# Patient Record
Sex: Female | Born: 1985 | Race: White | Hispanic: No | Marital: Single | Smoking: Current every day smoker
Health system: Southern US, Community
[De-identification: ages and names within clinical notes are randomized; demographics above are authoritative.]

## PROBLEM LIST (undated history)

## (undated) ENCOUNTER — Ambulatory Visit (HOSPITAL_COMMUNITY): Admission: EM | Discharge status: Absent without leave | Payer: Self-pay

## (undated) DIAGNOSIS — R001 Bradycardia, unspecified: Secondary | ICD-10-CM

## (undated) DIAGNOSIS — E119 Type 2 diabetes mellitus without complications: Secondary | ICD-10-CM

## (undated) DIAGNOSIS — N83209 Unspecified ovarian cyst, unspecified side: Secondary | ICD-10-CM

## (undated) HISTORY — PX: OTHER SURGICAL HISTORY: SHX169

## (undated) HISTORY — PX: THUMB ARTHROSCOPY: SHX2509

---
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
## 2007-01-20 ENCOUNTER — Emergency Department (HOSPITAL_COMMUNITY): Admission: EM | Admit: 2007-01-20 | Discharge: 2007-01-20 | Payer: Self-pay | Admitting: Family Medicine

## 2007-03-17 ENCOUNTER — Emergency Department (HOSPITAL_COMMUNITY): Admission: EM | Admit: 2007-03-17 | Discharge: 2007-03-17 | Payer: Self-pay | Admitting: Emergency Medicine

## 2007-06-24 ENCOUNTER — Emergency Department (HOSPITAL_COMMUNITY): Admission: EM | Admit: 2007-06-24 | Discharge: 2007-06-24 | Payer: Self-pay | Admitting: Emergency Medicine

## 2008-08-08 ENCOUNTER — Emergency Department (HOSPITAL_COMMUNITY): Admission: EM | Admit: 2008-08-08 | Discharge: 2008-08-08 | Payer: Self-pay | Admitting: Emergency Medicine

## 2008-10-23 ENCOUNTER — Emergency Department (HOSPITAL_COMMUNITY): Admission: EM | Admit: 2008-10-23 | Discharge: 2008-10-23 | Payer: Self-pay | Admitting: Emergency Medicine

## 2008-12-16 ENCOUNTER — Emergency Department (HOSPITAL_COMMUNITY): Admission: EM | Admit: 2008-12-16 | Discharge: 2008-12-17 | Payer: Self-pay | Admitting: Emergency Medicine

## 2009-09-17 ENCOUNTER — Emergency Department (HOSPITAL_COMMUNITY): Admission: EM | Admit: 2009-09-17 | Discharge: 2009-09-17 | Payer: Self-pay | Admitting: Emergency Medicine

## 2010-02-08 ENCOUNTER — Emergency Department (HOSPITAL_COMMUNITY): Admission: EM | Admit: 2010-02-08 | Discharge: 2010-02-08 | Payer: Self-pay | Admitting: Emergency Medicine

## 2010-09-20 ENCOUNTER — Inpatient Hospital Stay (HOSPITAL_COMMUNITY): Admission: EM | Admit: 2010-09-20 | Discharge: 2010-09-22 | Payer: Self-pay | Admitting: Emergency Medicine

## 2010-11-30 ENCOUNTER — Emergency Department (HOSPITAL_COMMUNITY)
Admission: EM | Admit: 2010-11-30 | Discharge: 2010-11-30 | Payer: Self-pay | Source: Home / Self Care | Admitting: Emergency Medicine

## 2011-03-13 LAB — URINALYSIS, ROUTINE W REFLEX MICROSCOPIC
Bilirubin Urine: NEGATIVE
Nitrite: NEGATIVE
Specific Gravity, Urine: 1.028 (ref 1.005–1.030)
pH: 6.5 (ref 5.0–8.0)

## 2011-03-15 LAB — CBC
MCV: 86.8 fL (ref 78.0–100.0)
Platelets: 223 10*3/uL (ref 150–400)
RBC: 5.09 MIL/uL (ref 3.87–5.11)
WBC: 8.1 10*3/uL (ref 4.0–10.5)

## 2011-03-15 LAB — ANAEROBIC CULTURE

## 2011-03-15 LAB — BASIC METABOLIC PANEL
Chloride: 106 mEq/L (ref 96–112)
GFR calc Af Amer: >60 mL/min (ref >60)
Potassium: 4 mEq/L (ref 3.5–5.1)

## 2011-03-21 LAB — URINALYSIS, ROUTINE W REFLEX MICROSCOPIC
Glucose, UA: NEGATIVE mg/dL
pH: 5.5 (ref 5.0–8.0)

## 2011-03-21 LAB — URINE MICROSCOPIC-ADD ON

## 2011-04-06 LAB — POCT PREGNANCY, URINE: Preg Test, Ur: NEGATIVE

## 2011-08-27 ENCOUNTER — Emergency Department (HOSPITAL_COMMUNITY)
Admission: EM | Admit: 2011-08-27 | Discharge: 2011-08-28 | Disposition: A | Discharge status: Home / Self Care | Payer: Self-pay | Attending: Emergency Medicine | Admitting: Emergency Medicine

## 2011-08-27 DIAGNOSIS — M545 Low back pain, unspecified: Secondary | ICD-10-CM | POA: Insufficient documentation

## 2011-08-27 DIAGNOSIS — G8929 Other chronic pain: Secondary | ICD-10-CM | POA: Insufficient documentation

## 2011-08-27 DIAGNOSIS — M62838 Other muscle spasm: Secondary | ICD-10-CM | POA: Insufficient documentation

## 2011-08-27 DIAGNOSIS — M79609 Pain in unspecified limb: Secondary | ICD-10-CM | POA: Insufficient documentation

## 2011-09-04 ENCOUNTER — Emergency Department (HOSPITAL_COMMUNITY)
Admission: EM | Admit: 2011-09-04 | Discharge: 2011-09-05 | Disposition: A | Discharge status: Home / Self Care | Payer: Self-pay | Attending: Emergency Medicine | Admitting: Emergency Medicine

## 2011-09-04 DIAGNOSIS — M545 Low back pain, unspecified: Secondary | ICD-10-CM | POA: Insufficient documentation

## 2011-09-28 LAB — URINALYSIS, ROUTINE W REFLEX MICROSCOPIC
Bilirubin Urine: NEGATIVE
Glucose, UA: NEGATIVE
Hgb urine dipstick: NEGATIVE
Ketones, ur: NEGATIVE
Nitrite: NEGATIVE
Protein, ur: NEGATIVE
Specific Gravity, Urine: 1.018
Urobilinogen, UA: 1
pH: 6.5

## 2011-09-28 LAB — WET PREP, GENITAL
Trich, Wet Prep: NONE SEEN
Yeast Wet Prep HPF POC: NONE SEEN

## 2011-09-28 LAB — URINE CULTURE: Colony Count: >=100000

## 2011-09-28 LAB — URINE MICROSCOPIC-ADD ON

## 2011-09-28 LAB — GC/CHLAMYDIA PROBE AMP, GENITAL
Chlamydia, DNA Probe: NEGATIVE
GC Probe Amp, Genital: NEGATIVE

## 2011-09-28 LAB — POCT PREGNANCY, URINE: Preg Test, Ur: NEGATIVE

## 2011-10-02 LAB — CBC
HCT: 35.7 — ABNORMAL LOW
Hemoglobin: 12.1
MCHC: 33.9
RDW: 12.7

## 2011-10-02 LAB — URINALYSIS, ROUTINE W REFLEX MICROSCOPIC
Bilirubin Urine: NEGATIVE
Ketones, ur: NEGATIVE
Nitrite: NEGATIVE
Protein, ur: NEGATIVE
Urobilinogen, UA: 0.2
pH: 6.5

## 2011-10-02 LAB — DIFFERENTIAL
Basophils Absolute: 0.1
Basophils Relative: 1
Monocytes Relative: 6
Neutro Abs: 4.4
Neutrophils Relative %: 59

## 2011-10-02 LAB — URINE CULTURE

## 2011-10-02 LAB — LIPASE, BLOOD: Lipase: 30

## 2011-10-02 LAB — WET PREP, GENITAL
Trich, Wet Prep: NONE SEEN
Yeast Wet Prep HPF POC: NONE SEEN

## 2011-10-02 LAB — COMPREHENSIVE METABOLIC PANEL
Alkaline Phosphatase: 51
BUN: 14
Calcium: 8.6
Glucose, Bld: 100 — ABNORMAL HIGH
Potassium: 3.9
Total Protein: 6.2

## 2011-10-02 LAB — POCT PREGNANCY, URINE: Preg Test, Ur: NEGATIVE

## 2011-10-02 LAB — GC/CHLAMYDIA PROBE AMP, GENITAL: GC Probe Amp, Genital: NEGATIVE

## 2012-11-29 ENCOUNTER — Emergency Department (HOSPITAL_COMMUNITY)
Admission: EM | Admit: 2012-11-29 | Discharge: 2012-11-29 | Disposition: A | Discharge status: Home / Self Care | Payer: Self-pay | Attending: Emergency Medicine | Admitting: Emergency Medicine

## 2012-11-29 ENCOUNTER — Encounter (HOSPITAL_COMMUNITY): Payer: Self-pay | Admitting: Emergency Medicine

## 2012-11-29 DIAGNOSIS — E669 Obesity, unspecified: Secondary | ICD-10-CM | POA: Insufficient documentation

## 2012-11-29 DIAGNOSIS — F17200 Nicotine dependence, unspecified, uncomplicated: Secondary | ICD-10-CM | POA: Insufficient documentation

## 2012-11-29 DIAGNOSIS — M79671 Pain in right foot: Secondary | ICD-10-CM

## 2012-11-29 DIAGNOSIS — M2140 Flat foot [pes planus] (acquired), unspecified foot: Secondary | ICD-10-CM | POA: Insufficient documentation

## 2012-11-29 DIAGNOSIS — L84 Corns and callosities: Secondary | ICD-10-CM | POA: Insufficient documentation

## 2012-11-29 MED ORDER — OXYCODONE-ACETAMINOPHEN 5-325 MG PO TABS: ORAL_TABLET | ORAL | Status: DC

## 2012-11-29 NOTE — Progress Notes (Signed)
Orthopedic Tech Progress Note Patient Details:  Jennifer Arnold [birth year] [id number] Patient fitted with crutches for height and comfort. Patient stated she has used crutches in the past and feels comfortable with use; demonstrated proper use  Ortho Devices Type of Ortho Device: Crutches Ortho Device/Splint Interventions: Application   Asia R Thompson 11/29/2012, 3:21 PM

## 2012-11-29 NOTE — ED Provider Notes (Signed)
History     CSN: [id number]  Arrival date & time 11/29/12  1410   First MD Initiated Contact with Patient 11/29/12 1419      Chief Complaint  Patient presents with  . Foot Pain    l/foot pain x 6 months    (Consider location/radiation/quality/duration/timing/severity/associated sxs/prior treatment) HPI  Jennifer Arnold is a 26 y.o. female complaining of bilateral foot pain worse on the left than the right for several months worsening over the course of last 2 days. Patient works 60 hours on her feet every week. She denies any trauma. She is overweight and describes pain as worse in the a.m. pain is rated at 8/10 described as aching. Denies trauma.   History reviewed. No pertinent past medical history.  Past Surgical History  Procedure Date  . Other surgical history     Thumb surgery    Family History  Problem Relation Age of Onset  . Hypertension Mother   . Diabetes Mother   . Asthma Father   . Osteoarthritis Father     History  Substance Use Topics  . Smoking status: Current Every Day Smoker    Types: Cigarettes  . Smokeless tobacco: Not on file  . Alcohol Use: Yes    OB History    Grav Para Term Preterm Abortions TAB SAB Ect Mult Living                  Review of Systems  Constitutional: Negative for fever.  Respiratory: Negative for shortness of breath.   Cardiovascular: Negative for chest pain.  Gastrointestinal: Negative for nausea, vomiting, abdominal pain and diarrhea.  All other systems reviewed and are negative.    Allergies  Review of patient's allergies indicates no known allergies.  Home Medications   Current Outpatient Rx  Name  Route  Sig  Dispense  Refill  . OXYCODONE-ACETAMINOPHEN 5-325 MG PO TABS      1 to 2 tabs PO q6hrs  PRN for pain   15 tablet   0     BP 142/77  Pulse 83  Temp 97.9 F (36.6 C) (Oral)  Resp 18  SpO2 100%  LMP 11/29/2012  Physical Exam  Nursing note and vitals reviewed. Constitutional: She is  oriented to person, place, and time. She appears well-developed and well-nourished. No distress.       Obese  HENT:  Head: Normocephalic and atraumatic.  Eyes: Conjunctivae normal and EOM are normal.  Cardiovascular: Normal rate.   Pulmonary/Chest: Effort normal. No stridor.  Musculoskeletal: Normal range of motion.       Left arch is fallen. 1 x 3 cm callous to great toe mound. Diffusely tender to palpation.   Neurological: She is alert and oriented to person, place, and time.  Psychiatric: She has a normal mood and affect.    ED Course  Procedures (including critical care time)  Labs Reviewed - No data to display No results found.   1. Pes planus   2. Foot pain, bilateral   3. Callus of foot   4. Obesity       MDM  Exacerbation of chronic foot pain. Patient has flat feet and likely plantar fasciitis based on pain worsening in the a.m. I have instructed her on how to perform and stretches and encourage her to follow with podiatry.   Pt verbalized understanding and agrees with care plan. Outpatient follow-up and return precautions given.  ]         Wynetta Emery, PA-C  11/29/12 1454  Bayan Kushnir, PA-C 11/29/12 1538

## 2012-11-29 NOTE — ED Notes (Signed)
Pt reports persistent feet pain. Pain in l/foot increased over the last few days. Denies injury

## 2012-11-29 NOTE — ED Provider Notes (Signed)
Medical screening examination/treatment/procedure(s) were performed by non-physician practitioner and as supervising physician I was immediately available for consultation/collaboration.   Celene Kras, MD 11/29/12 1640

## 2013-05-25 ENCOUNTER — Emergency Department (HOSPITAL_COMMUNITY)
Admission: EM | Admit: 2013-05-25 | Discharge: 2013-05-25 | Disposition: A | Discharge status: Home / Self Care | Payer: Self-pay | Attending: Emergency Medicine | Admitting: Emergency Medicine

## 2013-05-25 ENCOUNTER — Encounter (HOSPITAL_COMMUNITY): Payer: Self-pay | Admitting: *Deleted

## 2013-05-25 DIAGNOSIS — F17200 Nicotine dependence, unspecified, uncomplicated: Secondary | ICD-10-CM | POA: Insufficient documentation

## 2013-05-25 DIAGNOSIS — Z8619 Personal history of other infectious and parasitic diseases: Secondary | ICD-10-CM | POA: Insufficient documentation

## 2013-05-25 DIAGNOSIS — Z8742 Personal history of other diseases of the female genital tract: Secondary | ICD-10-CM | POA: Insufficient documentation

## 2013-05-25 DIAGNOSIS — N939 Abnormal uterine and vaginal bleeding, unspecified: Secondary | ICD-10-CM

## 2013-05-25 DIAGNOSIS — N938 Other specified abnormal uterine and vaginal bleeding: Secondary | ICD-10-CM | POA: Insufficient documentation

## 2013-05-25 DIAGNOSIS — N949 Unspecified condition associated with female genital organs and menstrual cycle: Secondary | ICD-10-CM | POA: Insufficient documentation

## 2013-05-25 DIAGNOSIS — Z3202 Encounter for pregnancy test, result negative: Secondary | ICD-10-CM | POA: Insufficient documentation

## 2013-05-25 HISTORY — DX: Unspecified ovarian cyst, unspecified side: N83.209

## 2013-05-25 LAB — URINE MICROSCOPIC-ADD ON

## 2013-05-25 LAB — WET PREP, GENITAL
Trich, Wet Prep: NONE SEEN
Yeast Wet Prep HPF POC: NONE SEEN

## 2013-05-25 LAB — CBC WITH DIFFERENTIAL/PLATELET
Basophils Absolute: 0 10*3/uL (ref 0.0–0.1)
Eosinophils Absolute: 0.2 10*3/uL (ref 0.0–0.7)
Eosinophils Relative: 2 % (ref 0–5)
HCT: 41.3 % (ref 36.0–46.0)
MCH: 30.1 pg (ref 26.0–34.0)
MCV: 85.9 fL (ref 78.0–100.0)
Monocytes Absolute: 0.4 10*3/uL (ref 0.1–1.0)
Platelets: 227 10*3/uL (ref 150–400)
RDW: 13.7 % (ref 11.5–15.5)

## 2013-05-25 LAB — URINALYSIS, ROUTINE W REFLEX MICROSCOPIC
Glucose, UA: NEGATIVE mg/dL
Leukocytes, UA: NEGATIVE
Specific Gravity, Urine: 1.014 (ref 1.005–1.030)
pH: 5.5 (ref 5.0–8.0)

## 2013-05-25 NOTE — ED Notes (Signed)
Pt c/o bleeding "alot" asked how many pads in 12 hours "sigh, a lot" states she is used to having a heavy cycle." Past year I have bled every single day"

## 2013-05-25 NOTE — ED Provider Notes (Signed)
History     CSN: [id number]  Arrival date & time 05/25/13  0003   First MD Initiated Contact with Patient 05/25/13 0040      Chief Complaint  Patient presents with  . Vaginal Bleeding    (Consider location/radiation/quality/duration/timing/severity/associated sxs/prior treatment) HPI Comments: PAtient reports vaginal bleeding for the past year light to heavy  Was treated for chlamydia 1 years ago and again 1 month ago( partner was treated at that time as well)   For the past 2 days vaginal bleeding has been heavier than normal   Patient is a 27 y.o. female presenting with vaginal bleeding. The history is provided by the patient.  Vaginal Bleeding Quality:  Bright red Severity:  Moderate Onset quality:  Unable to specify Timing:  Intermittent Progression:  Worsening Menstrual history:  Irregular Possible pregnancy: yes   Context: spontaneously   Relieved by:  Nothing Worsened by:  Nothing tried Ineffective treatments:  None tried Associated symptoms: no abdominal pain, no dizziness, no dysuria, no fever, no nausea and no vaginal discharge   Risk factors: STD and unprotected sex   Risk factors: no new sexual partner     Past Medical History  Diagnosis Date  . Ovarian cyst     Past Surgical History  Procedure Laterality Date  . Other surgical history      Thumb surgery  . Thumb arthroscopy      Family History  Problem Relation Age of Onset  . Hypertension Mother   . Diabetes Mother   . Asthma Father   . Osteoarthritis Father     History  Substance Use Topics  . Smoking status: Current Every Day Smoker    Types: Cigarettes  . Smokeless tobacco: Not on file  . Alcohol Use: No    OB History   Grav Para Term Preterm Abortions TAB SAB Ect Mult Living                  Review of Systems  Unable to perform ROS Constitutional: Negative for fever and chills.  Gastrointestinal: Negative for nausea and abdominal pain.  Genitourinary: Positive for vaginal  bleeding. Negative for dysuria, vaginal discharge and vaginal pain.  Musculoskeletal: Negative for myalgias.  Neurological: Negative for dizziness and weakness.  All other systems reviewed and are negative.    Allergies  Review of patient's allergies indicates no known allergies.  Home Medications  No current outpatient prescriptions on file.  BP 138/84  Pulse 89  Temp(Src) 98.5 F (36.9 C) (Oral)  Resp 20  SpO2 98%  Physical Exam  Nursing note and vitals reviewed. Constitutional: She is oriented to person, place, and time. She appears well-developed and well-nourished.  Morbid obesity  HENT:  Head: Normocephalic and atraumatic.  Eyes: Pupils are equal, round, and reactive to light.  Neck: Normal range of motion.  Cardiovascular: Normal rate and regular rhythm.   Pulmonary/Chest: Effort normal.  Abdominal: Bowel sounds are normal. She exhibits no distension. There is no tenderness.  Genitourinary: Vagina normal and uterus normal. Uterus is not tender. Cervix exhibits no discharge. Right adnexum displays no tenderness and no fullness. Left adnexum displays no tenderness and no fullness.  Small amount of blood in vaginal vault  Musculoskeletal: Normal range of motion. She exhibits no tenderness.  Neurological: She is alert and oriented to person, place, and time.  Skin: Skin is warm and dry.    ED Course  Procedures (including critical care time)  Labs Reviewed  WET PREP, GENITAL - Abnormal; Notable  for the following:    Clue Cells Wet Prep HPF POC RARE (*)    WBC, Wet Prep HPF POC RARE (*)    All other components within normal limits  URINALYSIS, ROUTINE W REFLEX MICROSCOPIC - Abnormal; Notable for the following:    Hgb urine dipstick LARGE (*)    All other components within normal limits  GC/CHLAMYDIA PROBE AMP  CBC WITH DIFFERENTIAL  URINE MICROSCOPIC-ADD ON  POCT PREGNANCY, URINE   No results found.   1. Abnormal vaginal bleeding       MDM   No  abnormalities in labs patient referred to [facility] for further evaluation         Arman Filter, NP 05/25/13 [phone]

## 2013-05-25 NOTE — ED Notes (Addendum)
Pt states she is not on birth control and has never been pregnant. She began spotting every day for a year but the past 4 days she has been bleeding  really bad. When she woke up the bed was covered. Pt went to health dept 3 or 4 weeks ago and was treated for chlamydia and bacterial vaginosis. Bleeding calmed down when she was on the medication but it started back extremely heavy. Pt has no other medical history.

## 2013-05-25 NOTE — ED Provider Notes (Signed)
Medical screening examination/treatment/procedure(s) were performed by non-physician practitioner and as supervising physician I was immediately available for consultation/collaboration.  Lyanne Co, MD 05/25/13 [phone]

## 2015-01-04 ENCOUNTER — Encounter (HOSPITAL_COMMUNITY): Payer: Self-pay | Admitting: Emergency Medicine

## 2015-01-04 ENCOUNTER — Emergency Department (HOSPITAL_COMMUNITY): Payer: BLUE CROSS/BLUE SHIELD

## 2015-01-04 ENCOUNTER — Emergency Department (HOSPITAL_COMMUNITY)
Admission: EM | Admit: 2015-01-04 | Discharge: 2015-01-04 | Disposition: A | Discharge status: Home / Self Care | Payer: BLUE CROSS/BLUE SHIELD | Attending: Emergency Medicine | Admitting: Emergency Medicine

## 2015-01-04 DIAGNOSIS — Z720 Tobacco use: Secondary | ICD-10-CM | POA: Diagnosis not present

## 2015-01-04 DIAGNOSIS — Z8742 Personal history of other diseases of the female genital tract: Secondary | ICD-10-CM | POA: Diagnosis not present

## 2015-01-04 DIAGNOSIS — M79672 Pain in left foot: Secondary | ICD-10-CM | POA: Insufficient documentation

## 2015-01-04 DIAGNOSIS — Z79899 Other long term (current) drug therapy: Secondary | ICD-10-CM | POA: Insufficient documentation

## 2015-01-04 MED ORDER — IBUPROFEN 200 MG PO TABS
600.0000 mg | ORAL_TABLET | Freq: Once | ORAL | Status: AC
  Administered 2015-01-04: 600 mg via ORAL
  Filled 2015-01-04: qty 3

## 2015-01-04 NOTE — ED Notes (Signed)
Pt states she has been having pain in her left foot for 2 days  Pt states yesterday the pain increased and she is having stabbing pain in the middle of her heal  Pt states she is unable to put pressure on her heel  Pt states last time they told her she had plantar fasciatus

## 2015-01-04 NOTE — Discharge Instructions (Signed)
Your x-ray does not reveal any abnormality in the bone formation.  There is no apparent abscess or change in the fat pad.  I would recommend getting a heel cup to place inside your shoe fracture.  Comfort.  You also can make an appointment with orthopedic surgery for further evaluation.  If your pain persists past 2 weeks

## 2015-01-04 NOTE — ED Provider Notes (Signed)
CSN: [id number]     Arrival date & time 01/04/15  7829 History   First MD Initiated Contact with Patient 01/04/15 [phone]     Chief Complaint  Patient presents with  . Foot Pain     (Consider location/radiation/quality/duration/timing/severity/associated sxs/prior Treatment) HPI Comments: This a normally healthy, morbidly obese female who's complaining of 2 days of left heel pain, worse when walking.  States she is unable to put full pressure on her foot.  She does have a history of plantar fasciitis over year ago.  She says pain is different as the plantar fasciitis pain is more in the arch of her foot and this is definitely just didn't heal.  She denies any trauma, history of plantar warts  Patient is a 29 y.o. female presenting with lower extremity pain. The history is provided by the patient.  Foot Pain This is a new problem. The current episode started in the past 7 days. The problem occurs constantly. The problem has been unchanged. Pertinent negatives include no fever, numbness, rash or weakness. The symptoms are aggravated by walking. She has tried nothing for the symptoms. The treatment provided no relief.    Past Medical History  Diagnosis Date  . Ovarian cyst    Past Surgical History  Procedure Laterality Date  . Other surgical history      Thumb surgery  . Thumb arthroscopy     Family History  Problem Relation Age of Onset  . Hypertension Mother   . Diabetes Mother   . Asthma Father   . Osteoarthritis Father   . Diabetes Other   . Hypertension Other   . Stroke Other    History  Substance Use Topics  . Smoking status: Current Every Day Smoker    Types: Cigarettes  . Smokeless tobacco: Not on file  . Alcohol Use: No   OB History    No data available     Review of Systems  Constitutional: Negative for fever.  Skin: Negative for rash and wound.  Neurological: Negative for weakness and numbness.  All other systems reviewed and are negative.     Allergies    Review of patient's allergies indicates no known allergies.  Home Medications   Prior to Admission medications   Medication Sig Start Date End Date Taking? Authorizing Provider  POTASSIUM PO Take 1 tablet by mouth daily.   Yes Historical Provider, MD   BP 142/62 mmHg  Pulse 90  Resp 18  Ht  (1.753 m)  Wt 305 lb (138.347 kg)  BMI 45.02 kg/m2  SpO2 99%  LMP  (LMP Unknown) Physical Exam  Constitutional: She appears well-developed and well-nourished.  HENT:  Head: Normocephalic.  Eyes: Pupils are equal, round, and reactive to light.  Neck: Normal range of motion.  Cardiovascular: Normal rate and regular rhythm.   Pulmonary/Chest: Effort normal and breath sounds normal.  Musculoskeletal: Normal range of motion. She exhibits tenderness. She exhibits no edema.       Feet:  Neurological: She is alert.  Skin: Skin is warm. No erythema.  Nursing note and vitals reviewed.   ED Course  Procedures (including critical care time) Labs Review Labs Reviewed - No data to display  Imaging Review Dg Foot Complete Left  01/04/2015   CLINICAL DATA:  Increasing left heel pain. No known trauma. Patient cannot put weight on foot.  EXAM: LEFT FOOT - COMPLETE 3+ VIEW  COMPARISON:  None.  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  No evidence of acute fracture or subluxation. No focal bone lesion or bone destruction. Bone cortex and trabecular architecture appear intact. No radiopaque soft tissue foreign bodies.  IMPRESSION: No acute bony abnormalities.   Electronically Signed   By: Burman NievesWilliam  Stevens M.D.   On: 01/04/2015 05:08     EKG Interpretation None      MDM  X-rays been reviewed.  There is no heel spur.  There is no decrease in the fat pad on the heel.  I recommend that patient purchase a heel cup to place inside her shoe, given a couple weeks with regular doses of ibuprofen.  If pain persists, follow-up with orthopedic surgery Final diagnoses:   Heel pain, left         Arman FilterGail K Huxley Shurley, NP 01/04/15 [phone]  Tomasita CrumbleAdeleke Oni, MD 01/04/15 [phone]

## 2015-08-30 IMAGING — CR DG FOOT COMPLETE 3+V*L*
3 series · 3 of 3 positions shown · non-contrast
Comparison: None.

CLINICAL DATA: Increasing left heel pain. No known trauma. Patient
cannot put weight on foot.

EXAM:
LEFT FOOT - COMPLETE 3+ VIEW

[x foot ap left]
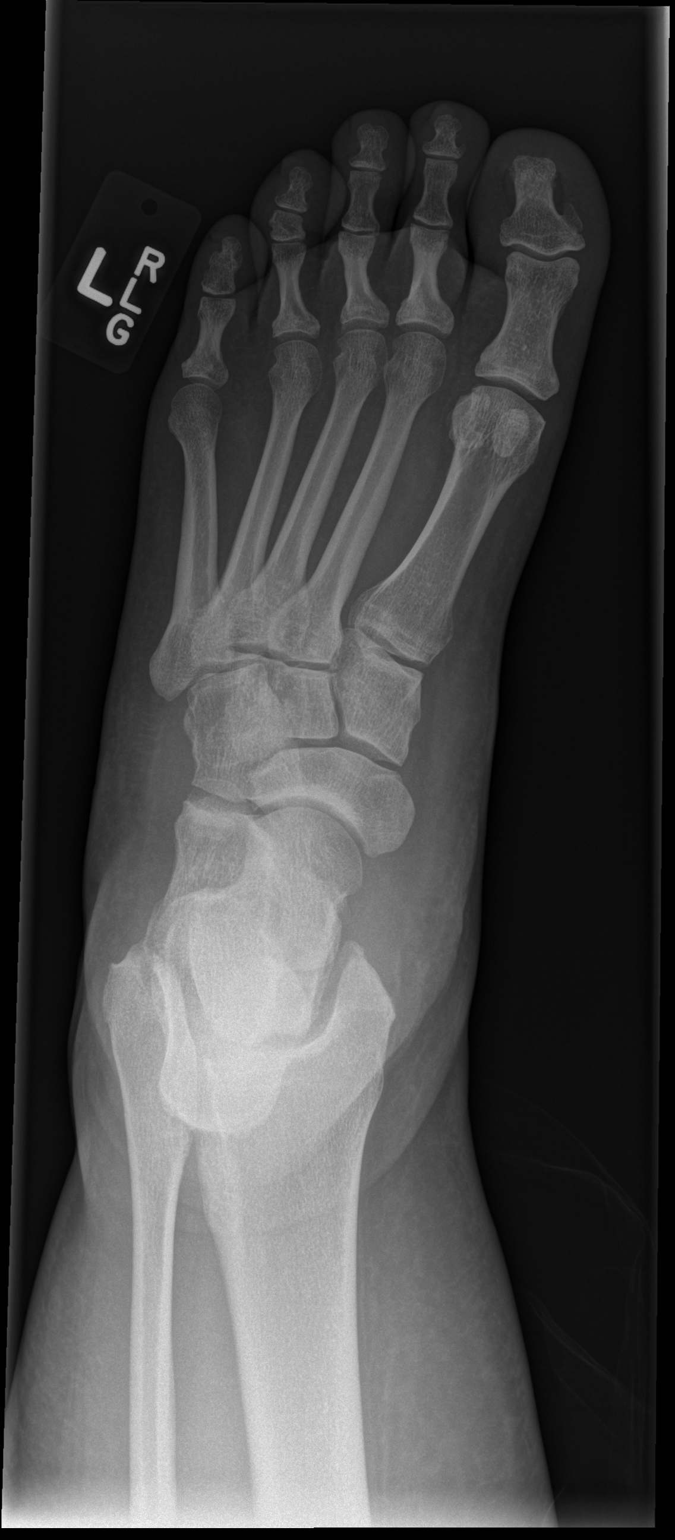

[x foot obl left]
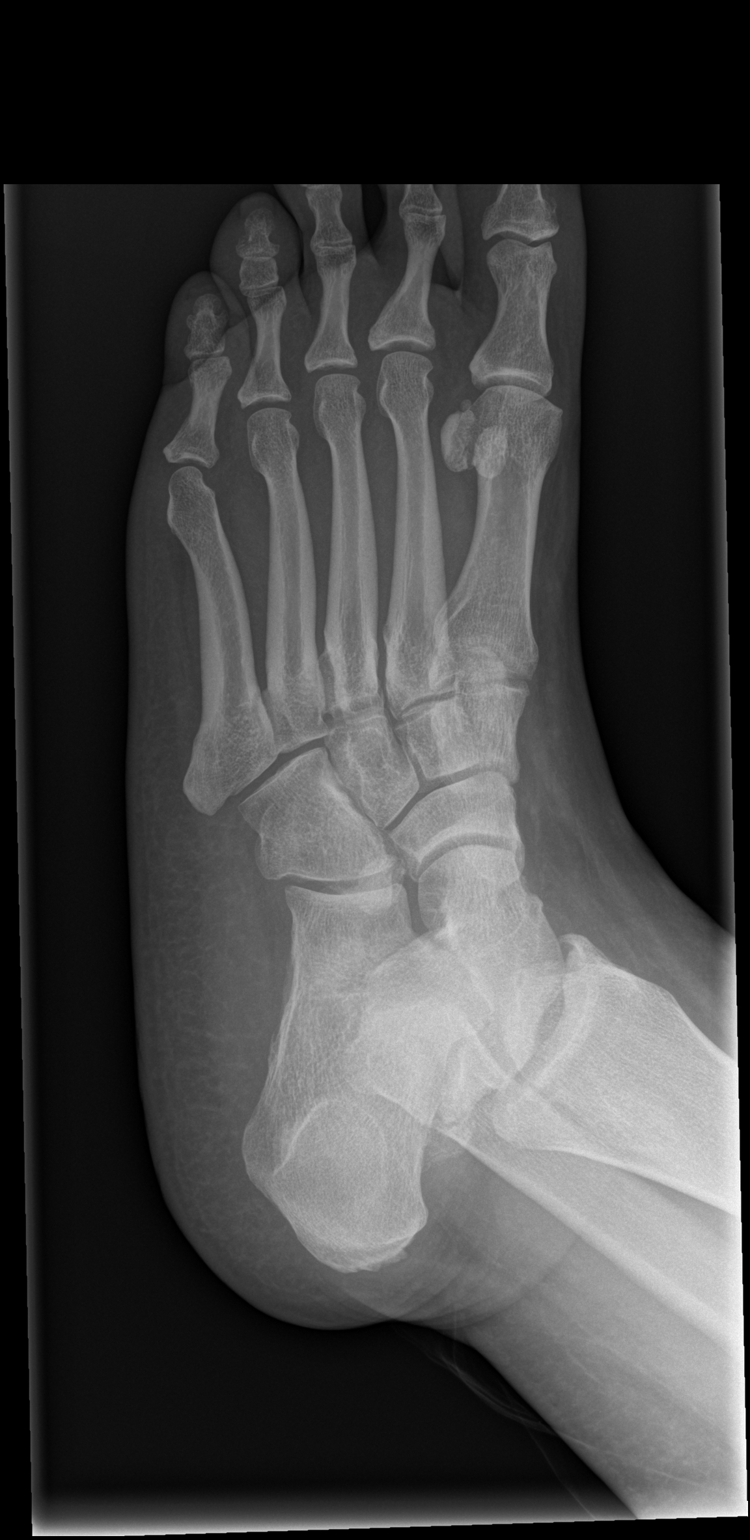

[x foot lat left]
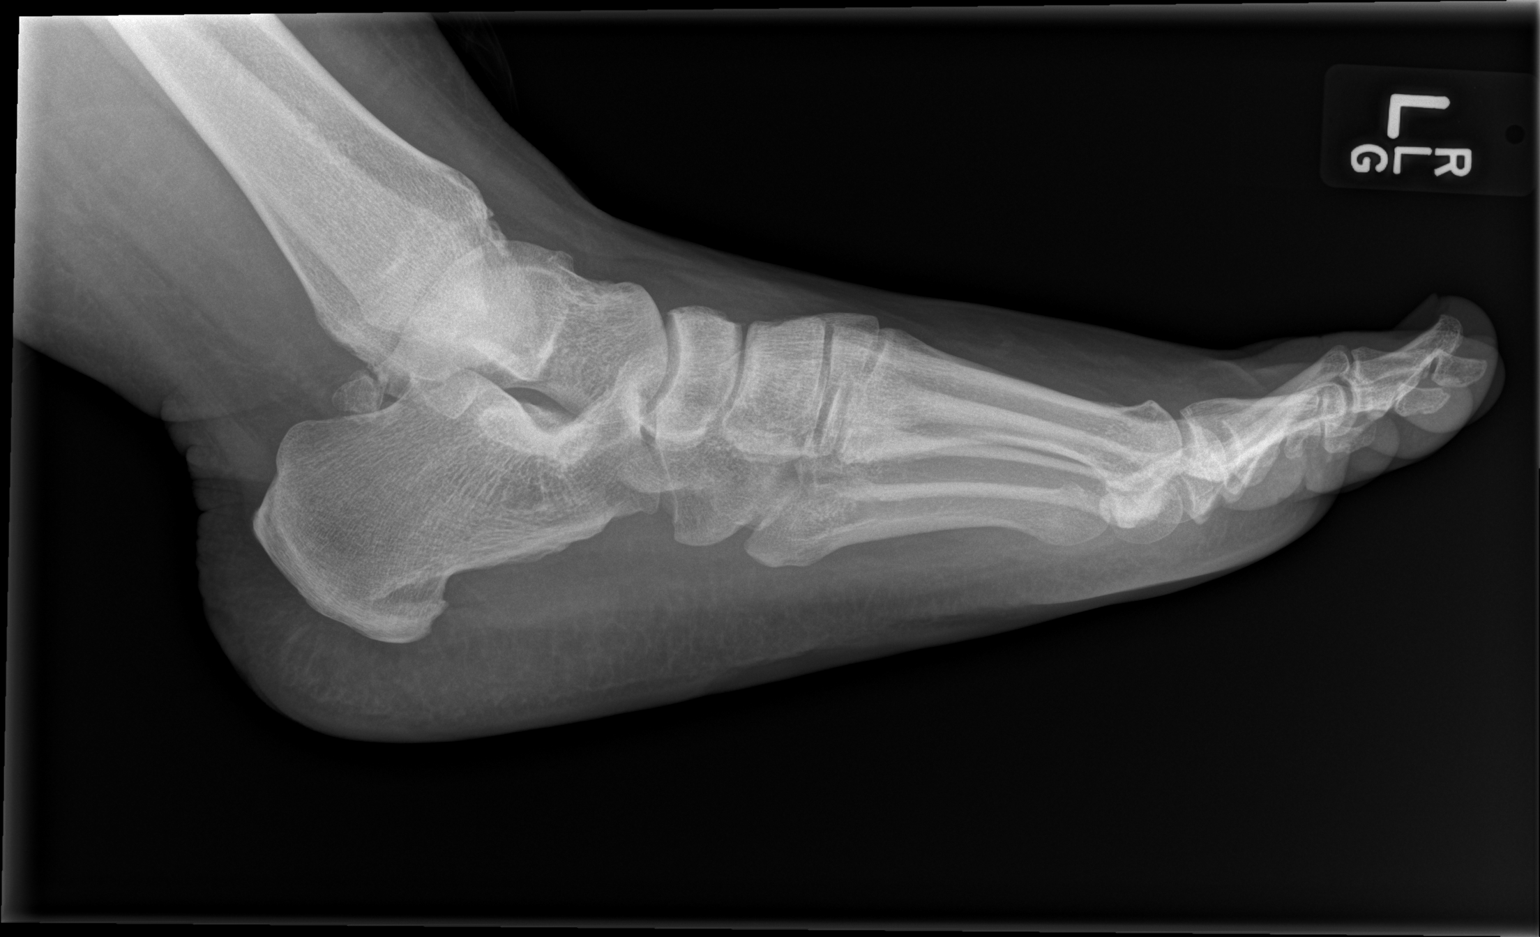

[3 of 3 positions shown; findings below may reference images not displayed]

FINDINGS: Ununited ossicle posterior to the talus consistent with os trigonum.
No evidence of acute fracture or subluxation. No focal bone lesion
or bone destruction. Bone cortex and trabecular architecture appear
intact. No radiopaque soft tissue foreign bodies.
IMPRESSION: No acute bony abnormalities.

## 2018-05-01 ENCOUNTER — Encounter (HOSPITAL_COMMUNITY): Payer: Self-pay | Admitting: Family Medicine

## 2018-05-01 ENCOUNTER — Ambulatory Visit (HOSPITAL_COMMUNITY)
Admission: EM | Admit: 2018-05-01 | Discharge: 2018-05-01 | Disposition: A | Discharge status: Home / Self Care | Payer: BLUE CROSS/BLUE SHIELD | Attending: Family Medicine | Admitting: Family Medicine

## 2018-05-01 DIAGNOSIS — M5410 Radiculopathy, site unspecified: Secondary | ICD-10-CM

## 2018-05-01 MED ORDER — HYDROCODONE-ACETAMINOPHEN 5-325 MG PO TABS: 1.0000 | ORAL_TABLET | Freq: Four times a day (QID) | ORAL | 0 refills | Status: AC | PRN

## 2018-05-01 MED ORDER — PREDNISONE 50 MG PO TABS: 50.0000 mg | ORAL_TABLET | Freq: Every day | ORAL | 0 refills | Status: AC

## 2018-05-01 MED ORDER — CYCLOBENZAPRINE HCL 10 MG PO TABS: 10.0000 mg | ORAL_TABLET | Freq: Two times a day (BID) | ORAL | 0 refills | Status: DC | PRN

## 2018-05-01 MED ORDER — KETOROLAC TROMETHAMINE 60 MG/2ML IM SOLN
60.0000 mg | Freq: Once | INTRAMUSCULAR | Status: AC
  Administered 2018-05-01: 60 mg via INTRAMUSCULAR

## 2018-05-01 MED ORDER — KETOROLAC TROMETHAMINE 60 MG/2ML IM SOLN
INTRAMUSCULAR | Status: AC
  Filled 2018-05-01: qty 2

## 2018-05-01 MED ORDER — IBUPROFEN 800 MG PO TABS: 800.0000 mg | ORAL_TABLET | Freq: Three times a day (TID) | ORAL | 0 refills | Status: DC
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
## 2018-05-01 NOTE — Discharge Instructions (Signed)
Begin prednisone 50 mg daily with breakfast for the next 5 days. Mild to moderate pain: Use anti-inflammatories for pain/swelling. You may take up to 800 mg Ibuprofen every 8 hours with food. You may supplement Ibuprofen with Tylenol [phone] mg every 8 hours.   You may supplement with Flexeril every 12 hours as needed, this will cause sedation, do not drive after use.  For severe pain: you may use hydrocodone, do not supplement with Tylenol as it already has Tylenol in it, this will also cause sedation, do not mix with Flexeril.  Do not drive after use.  Please reserve for use at home or bedtime.  Please return if symptoms not improving, symptoms worsening, develop lack of bowel or bladder control, numbness or tingling
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
## 2018-05-14 ENCOUNTER — Other Ambulatory Visit: Payer: Self-pay

## 2018-05-14 ENCOUNTER — Encounter (HOSPITAL_COMMUNITY): Payer: Self-pay | Admitting: Emergency Medicine

## 2018-05-14 ENCOUNTER — Ambulatory Visit (HOSPITAL_COMMUNITY)
Admission: EM | Admit: 2018-05-14 | Discharge: 2018-05-14 | Disposition: A | Discharge status: Home / Self Care | Payer: Self-pay | Attending: Family Medicine | Admitting: Family Medicine

## 2018-05-14 DIAGNOSIS — M545 Low back pain: Secondary | ICD-10-CM

## 2018-05-14 HISTORY — DX: Bradycardia, unspecified: R00.1

## 2018-05-14 MED ORDER — PREDNISONE 10 MG (48) PO TBPK: ORAL_TABLET | ORAL | 0 refills | Status: DC

## 2018-05-14 MED ORDER — HYDROCODONE-ACETAMINOPHEN 5-325 MG PO TABS: 1.0000 | ORAL_TABLET | Freq: Four times a day (QID) | ORAL | 0 refills | Status: DC | PRN

## 2018-05-14 NOTE — ED Triage Notes (Signed)
Patient reports back pain is not responding to medications as expected by patient . Patient has returned for reevaluation .  Patient states she has been out of work for a few days

## 2018-05-14 NOTE — Discharge Instructions (Signed)
Be aware, pain medications may cause drowsiness. Please do not drive, operate heavy machinery or make important decisions while on this medication, it can cloud your judgement. ° °HOME CARE INSTRUCTIONS: For many people, back pain returns. Since low back pain is rarely dangerous, it is often a condition that people can learn to manage on their own. Please remain active. It is stressful on the back to sit or stand in one place. Do not sit, drive, or stand in one place for more than 30 minutes at a time. Take short walks on level surfaces as soon as pain allows. Try to increase the length of time you walk each day. Do not stay in bed. Resting more than 1 or 2 days can delay your recovery. Do not avoid exercise or work. Your body is made to move. It is not dangerous to be active, even though your back may hurt. Your back will likely heal faster if you return to being active before your pain is gone. Over-the-counter medicines to reduce pain and inflammation are often the most helpful. ° °SEEK MEDICAL CARE IF: °You have pain that is not relieved with rest or medicine. °You have pain that does not improve in 1 week. °You have new symptoms. °You are generally not feeling well. ° °SEEK IMMEDIATE MEDICAL CARE IF: °You have pain that radiates from your back into your legs. °You develop new bowel or bladder control problems. °You have unusual weakness or numbness in your arms or legs. °You develop nausea or vomiting. °You develop abdominal pain. °You feel faint. °
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
## 2020-01-11 ENCOUNTER — Emergency Department (HOSPITAL_COMMUNITY)
Admission: EM | Admit: 2020-01-11 | Discharge: 2020-01-12 | Disposition: A | Discharge status: Home / Self Care | Payer: Self-pay | Attending: Emergency Medicine | Admitting: Emergency Medicine

## 2020-01-11 ENCOUNTER — Encounter (HOSPITAL_COMMUNITY): Payer: Self-pay | Admitting: Emergency Medicine

## 2020-01-11 ENCOUNTER — Other Ambulatory Visit: Payer: Self-pay

## 2020-01-11 DIAGNOSIS — Z5321 Procedure and treatment not carried out due to patient leaving prior to being seen by health care provider: Secondary | ICD-10-CM | POA: Insufficient documentation

## 2020-01-11 DIAGNOSIS — E1165 Type 2 diabetes mellitus with hyperglycemia: Secondary | ICD-10-CM | POA: Insufficient documentation

## 2020-01-11 LAB — CBC WITH DIFFERENTIAL/PLATELET
Abs Immature Granulocytes: 0.01 10*3/uL (ref 0.00–0.07)
Basophils Absolute: 0 10*3/uL (ref 0.0–0.1)
Basophils Relative: 1 %
Eosinophils Absolute: 0.1 10*3/uL (ref 0.0–0.5)
Eosinophils Relative: 2 %
HCT: 41.1 % (ref 36.0–46.0)
Hemoglobin: 13.1 g/dL (ref 12.0–15.0)
Immature Granulocytes: 0 %
Lymphocytes Relative: 29 %
Lymphs Abs: 1.4 10*3/uL (ref 0.7–4.0)
MCH: 26.4 pg (ref 26.0–34.0)
MCHC: 31.9 g/dL (ref 30.0–36.0)
MCV: 82.7 fL (ref 80.0–100.0)
Monocytes Absolute: 0.3 10*3/uL (ref 0.1–1.0)
Monocytes Relative: 6 %
Neutro Abs: 3 10*3/uL (ref 1.7–7.7)
Neutrophils Relative %: 62 %
Platelets: 283 10*3/uL (ref 150–400)
RBC: 4.97 MIL/uL (ref 3.87–5.11)
RDW: 14.9 % (ref 11.5–15.5)
WBC: 4.9 10*3/uL (ref 4.0–10.5)
nRBC: 0 % (ref 0.0–0.2)

## 2020-01-11 LAB — URINALYSIS, ROUTINE W REFLEX MICROSCOPIC
Bacteria, UA: NONE SEEN
Bilirubin Urine: NEGATIVE
Glucose, UA: >=500 mg/dL — AB
Hgb urine dipstick: NEGATIVE
Ketones, ur: 5 mg/dL — AB
Leukocytes,Ua: NEGATIVE
Nitrite: NEGATIVE
Protein, ur: NEGATIVE mg/dL
Specific Gravity, Urine: 1.027 (ref 1.005–1.030)
pH: 6 (ref 5.0–8.0)

## 2020-01-11 LAB — CBG MONITORING, ED: Glucose-Capillary: 508 mg/dL (ref 70–99)

## 2020-01-11 NOTE — ED Triage Notes (Signed)
Patient reports elevated blood sugar at home this evening CBG=565 , denies any symptoms , recently diagnosed with DM she is not taking medications . Respirations unlabored /denies pain .

## 2020-01-12 ENCOUNTER — Ambulatory Visit (HOSPITAL_COMMUNITY)
Admission: EM | Admit: 2020-01-12 | Discharge: 2020-01-12 | Disposition: A | Discharge status: Home / Self Care | Payer: Self-pay | Attending: Family Medicine | Admitting: Family Medicine

## 2020-01-12 ENCOUNTER — Encounter (HOSPITAL_COMMUNITY): Payer: Self-pay

## 2020-01-12 DIAGNOSIS — E119 Type 2 diabetes mellitus without complications: Secondary | ICD-10-CM

## 2020-01-12 LAB — COMPREHENSIVE METABOLIC PANEL
ALT: 20 U/L (ref 0–44)
AST: 15 U/L (ref 15–41)
Albumin: 3.7 g/dL (ref 3.5–5.0)
Alkaline Phosphatase: 61 U/L (ref 38–126)
Anion gap: 8 (ref 5–15)
BUN: 18 mg/dL (ref 6–20)
CO2: 25 mmol/L (ref 22–32)
Calcium: 9.2 mg/dL (ref 8.9–10.3)
Chloride: 98 mmol/L (ref 98–111)
Creatinine, Ser: 1.33 mg/dL — ABNORMAL HIGH (ref 0.44–1.00)
GFR calc Af Amer: >60 mL/min (ref >60)
GFR calc non Af Amer: 52 mL/min — ABNORMAL LOW (ref >60)
Glucose, Bld: 550 mg/dL (ref 70–99)
Potassium: 4.4 mmol/L (ref 3.5–5.1)
Sodium: 131 mmol/L — ABNORMAL LOW (ref 135–145)
Total Bilirubin: 0.3 mg/dL (ref 0.3–1.2)
Total Protein: 6.5 g/dL (ref 6.5–8.1)

## 2020-01-12 LAB — GLUCOSE, CAPILLARY: Glucose-Capillary: 360 mg/dL — ABNORMAL HIGH (ref 70–99)

## 2020-01-12 LAB — CBG MONITORING, ED: Glucose-Capillary: 360 mg/dL — ABNORMAL HIGH (ref 70–99)

## 2020-01-12 MED ORDER — METFORMIN HCL 500 MG PO TABS: 500.0000 mg | ORAL_TABLET | Freq: Two times a day (BID) | ORAL | 1 refills | Status: DC

## 2020-01-12 NOTE — ED Provider Notes (Signed)
[place]    CSN: [id number] Arrival date & time: 01/12/20  1610      History   Chief Complaint No chief complaint on file.   HPI Jennifer Arnold is a 34 y.o. female.  Patient was seen in the emergency room last night and left without being seen but did have some blood work which showed a blood sugar of 550.  She is just released from prison in the last several weeks.  She tells me that her hemoglobin A1c was 6.74 months ago and now is 13.25.  She has had some blurry vision.  She denies polyuria but does have polydipsia.  She has had some weight loss.  She comes today for treatment of her elevated blood sugar and diabetes   HPI  Past Medical History:  Diagnosis Date  . Bradycardia   . Ovarian cyst     There are no problems to display for this patient.   Past Surgical History:  Procedure Laterality Date  . OTHER SURGICAL HISTORY     Thumb surgery  . THUMB ARTHROSCOPY      OB History   No obstetric history on file.      Home Medications    Prior to Admission medications   Medication Sig Start Date End Date Taking? Authorizing Provider  cyclobenzaprine (FLEXERIL) 10 MG tablet Take 1 tablet (10 mg total) by mouth 2 (two) times daily as needed for muscle spasms. 05/01/18   Wieters, Hallie C, PA-C  HYDROcodone-acetaminophen (NORCO/VICODIN) 5-325 MG tablet Take 1 tablet by mouth every 6 (six) hours as needed for moderate pain. 05/14/18   Vanessa Kick, MD  ibuprofen (ADVIL,MOTRIN) 800 MG tablet Take 1 tablet (800 mg total) by mouth 3 (three) times daily. 05/01/18   Wieters, Hallie C, PA-C  predniSONE (STERAPRED UNI-PAK 48 TAB) 10 MG (48) TBPK tablet Take as directed. 05/14/18   Vanessa Kick, MD    Family History Family History  Problem Relation Age of Onset  . Hypertension Mother   . Diabetes Mother   . Asthma Father   . Osteoarthritis Father   . Diabetes Other   . Hypertension Other   . Stroke Other     Social History Social History   Tobacco Use   . Smoking status: Current Every Day Smoker    Types: Cigarettes  Substance Use Topics  . Alcohol use: No  . Drug use: No     Allergies   Patient has no known allergies.   Review of Systems Review of Systems  Constitutional: Positive for unexpected weight change.  Eyes: Positive for visual disturbance.  Cardiovascular: Negative.   Endocrine: Positive for polydipsia.  Psychiatric/Behavioral: Negative.      Physical Exam Triage Vital Signs ED Triage Vitals  Enc Vitals Group     BP      Pulse      Resp      Temp      Temp src      SpO2      Weight      Height      Head Circumference      Peak Flow      Pain Score      Pain Loc      Pain Edu?      Excl. in [place]?    No data found.  Updated Vital Signs There were no vitals taken for this visit.  Visual Acuity Right Eye Distance:   Left Eye Distance:   Bilateral Distance:  Right Eye Near:   Left Eye Near:    Bilateral Near:     Physical Exam Vitals and nursing note reviewed.  Constitutional:      Appearance: Normal appearance. She is obese.  Cardiovascular:     Rate and Rhythm: Normal rate and regular rhythm.     Pulses: Normal pulses.  Pulmonary:     Effort: Pulmonary effort is normal.     Breath sounds: Normal breath sounds.  Psychiatric:        Mood and Affect: Mood normal.        Behavior: Behavior normal.      UC Treatments / Results  Labs (all labs ordered are listed, but only abnormal results are displayed) Labs Reviewed - No data to display  EKG   Radiology No results found.  Procedures Procedures (including critical care time)  Medications Ordered in UC Medications - No data to display  Initial Impression / Assessment and Plan / UC Course  I have reviewed the triage vital signs and the nursing notes.  Pertinent labs & imaging results that were available during my care of the patient were reviewed by me and considered in my medical decision making (see chart for details).  Random blood sugar here in the clinic was 360 today.    Type 2 diabetes mellitus.  We spent a good deal of time talking about diabetes and possible complications importance of diet and exercise and weight loss.  Her creatinine is 1.0 and I believe she would be a reasonable candidate to start on Metformin 500 mg twice daily.  I provided name of primary care physician for her to follow-up Final Clinical Impressions(s) / UC Diagnoses   Final diagnoses:  None   Discharge Instructions   None    ED Prescriptions    None     PDMP not reviewed this encounter.   Frederica Kuster, MD 01/12/20 1759

## 2020-01-12 NOTE — ED Triage Notes (Signed)
Pt c/o high blood sugar x4-5 days. Went to [facility] E.D last night and CBG was >500, but pt chose to leave w/o being seen 2/2 wait time.   Pt c/o blurry vision x2-3 weeks. Pt is wearing corrective Rx lenses that aid in distance and near vision, but states she also has to wear magnifying "reader" glasses which she has on top of her Rx Pt states recently freed from incarceration where she was given new dx of DM2 on Monday 01/09/20 states she was told her A1C was 13 2.5 to 3 weeks ago.  Denies abd pain n/v/d or SOB. Resp are regular and even.  CBG performed:  360 Pt states she does not have any insulin Rx or any other Rx to control blood sugar.

## 2020-01-12 NOTE — ED Notes (Signed)
Pt was called out for repeat v/s, no answer

## 2020-01-12 NOTE — ED Notes (Signed)
Pt was called for repeat v/s x3, no answer is presumed to have left the building and is off the premises.

## 2020-02-24 ENCOUNTER — Other Ambulatory Visit: Payer: Self-pay

## 2020-02-24 ENCOUNTER — Ambulatory Visit (HOSPITAL_COMMUNITY)
Admission: EM | Admit: 2020-02-24 | Discharge: 2020-02-24 | Disposition: A | Discharge status: Home / Self Care | Payer: HRSA Program | Attending: Family Medicine | Admitting: Family Medicine

## 2020-02-24 ENCOUNTER — Encounter (HOSPITAL_COMMUNITY): Payer: Self-pay

## 2020-02-24 DIAGNOSIS — Z20822 Contact with and (suspected) exposure to covid-19: Secondary | ICD-10-CM | POA: Diagnosis not present

## 2020-02-24 DIAGNOSIS — R509 Fever, unspecified: Secondary | ICD-10-CM

## 2020-02-24 DIAGNOSIS — F17210 Nicotine dependence, cigarettes, uncomplicated: Secondary | ICD-10-CM | POA: Diagnosis not present

## 2020-02-24 DIAGNOSIS — Z8249 Family history of ischemic heart disease and other diseases of the circulatory system: Secondary | ICD-10-CM | POA: Insufficient documentation

## 2020-02-24 DIAGNOSIS — Z8261 Family history of arthritis: Secondary | ICD-10-CM | POA: Insufficient documentation

## 2020-02-24 DIAGNOSIS — Z825 Family history of asthma and other chronic lower respiratory diseases: Secondary | ICD-10-CM | POA: Insufficient documentation

## 2020-02-24 DIAGNOSIS — Z833 Family history of diabetes mellitus: Secondary | ICD-10-CM | POA: Diagnosis not present

## 2020-02-24 DIAGNOSIS — E1165 Type 2 diabetes mellitus with hyperglycemia: Secondary | ICD-10-CM

## 2020-02-24 LAB — POC SARS CORONAVIRUS 2 AG: SARS Coronavirus 2 Ag: NEGATIVE

## 2020-02-24 LAB — POCT URINALYSIS DIP (DEVICE)
Bilirubin Urine: NEGATIVE
Glucose, UA: >=1000 mg/dL — AB
Hgb urine dipstick: NEGATIVE
Ketones, ur: NEGATIVE mg/dL
Leukocytes,Ua: NEGATIVE
Nitrite: NEGATIVE
Protein, ur: NEGATIVE mg/dL
Specific Gravity, Urine: 1.02 (ref 1.005–1.030)
Urobilinogen, UA: 0.2 mg/dL (ref 0.0–1.0)
pH: 5.5 (ref 5.0–8.0)

## 2020-02-24 LAB — CBG MONITORING, ED: Glucose-Capillary: 264 mg/dL — ABNORMAL HIGH (ref 70–99)

## 2020-02-24 LAB — POC SARS CORONAVIRUS 2 AG -  ED: SARS Coronavirus 2 Ag: NEGATIVE

## 2020-02-24 LAB — GLUCOSE, CAPILLARY: Glucose-Capillary: 264 mg/dL — ABNORMAL HIGH (ref 70–99)

## 2020-02-24 MED ORDER — "INSULIN SYRINGE 29G X 1/2"" 1 ML MISC": 0 refills | Status: DC

## 2020-02-24 MED ORDER — INSULIN GLARGINE 100 UNIT/ML SOLOSTAR PEN: 10.0000 [IU] | PEN_INJECTOR | Freq: Every day | SUBCUTANEOUS | 1 refills | Status: DC

## 2020-02-24 NOTE — Discharge Instructions (Addendum)
BP (!) 114/50 (BP Location: Right Arm)   Pulse (!) 105   Temp (!) 101.3 F (38.5 C) (Oral)   Resp 18   Wt 127 kg   LMP 02/17/2020   SpO2 97%   BMI 41.35 kg/m

## 2020-02-24 NOTE — ED Triage Notes (Signed)
Pt state she has a fever, bodyaches and her blood sugar has been very high because she dose not have a PCP. Pt states the symptoms started today.

## 2020-02-27 ENCOUNTER — Telehealth (HOSPITAL_COMMUNITY): Payer: Self-pay | Admitting: Family Medicine

## 2020-02-27 LAB — NOVEL CORONAVIRUS, NAA (HOSP ORDER, SEND-OUT TO REF LAB; TAT 18-24 HRS): SARS-CoV-2, NAA: NOT DETECTED

## 2020-02-27 MED ORDER — GLIPIZIDE ER 5 MG PO TB24: 5.0000 mg | ORAL_TABLET | Freq: Every day | ORAL | 0 refills | Status: DC

## 2020-02-27 MED ORDER — METFORMIN HCL 500 MG PO TABS: 1000.0000 mg | ORAL_TABLET | Freq: Two times a day (BID) | ORAL | 0 refills | Status: DC

## 2020-02-27 NOTE — Telephone Encounter (Signed)
Insulin too expensive.  Begin:  Meds ordered this encounter  Medications  . metFORMIN (GLUCOPHAGE) 500 MG tablet    Sig: Take 2 tablets (1,000 mg total) by mouth 2 (two) times daily with a meal.    Dispense:  360 tablet    Refill:  0  . glipiZIDE (GLUCOTROL XL) 5 MG 24 hr tablet    Sig: Take 1 tablet (5 mg total) by mouth daily with breakfast.    Dispense:  90 tablet    Refill:  0   To titrate metformin up over the next week.  May f/u here in a couple of weeks.

## 2020-02-27 NOTE — ED Provider Notes (Signed)
[facility]   [id number] 02/24/20 Arrival Time: 1839  ASSESSMENT & PLAN:  1. Fever and chills   2. Uncontrolled type 2 diabetes mellitus with hyperglycemia (HCC)     Rapid COVID negative. COVID PCR testing sent.  OTC symptom care.  She would like to initiate: Meds ordered this encounter  Medications  . Insulin Glargine (LANTUS) 100 UNIT/ML Solostar Pen    Sig: Inject 10 Units into the skin daily.    Dispense:  15 mL    Refill:  1  . INSULIN SYRINGE 1CC/29G 29G X 1/2" 1 ML MISC    Sig: Use with insulin pen.    Dispense:  100 each    Refill:  0    Labs Reviewed  GLUCOSE, CAPILLARY - Abnormal; Notable for the following components:      Result Value   Glucose-Capillary 264 (*)    All other components within normal limits  CBG MONITORING, ED - Abnormal; Notable for the following components:   Glucose-Capillary 264 (*)    All other components within normal limits  POCT URINALYSIS DIP (DEVICE) - Abnormal; Notable for the following components:   Glucose, UA >=1000 (*)    All other components within normal limits  NOVEL CORONAVIRUS, NAA (HOSP ORDER, SEND-OUT TO REF LAB; TAT 18-24 HRS)  POC SARS CORONAVIRUS 2 AG -  ED  POC SARS CORONAVIRUS 2 AG   No s/s of DKA. She is able to check her blood sugar at home.   Follow-up Information    [facility] EMERGENCY DEPARTMENT.   Specialty: Emergency Medicine Why: If symptoms worsen in any way. Contact information: [address] [id number] mc [place] [address] [phone]  Or if blood sugars are persistently greater than 400.       Will do her best to ensure adequate fluid intake. May f/u here in a few days to recheck if needed.  Reviewed expectations re: course of current medical issues. Questions answered. Outlined signs and symptoms indicating need for more acute intervention. Understanding verbalized. After Visit Summary given.   SUBJECTIVE: History from:  patient. Jennifer Arnold is a 34 y.o. female who reports "not feeling well". Fairly abrupt onset yesterday but mostly feeling bad today. Subjective fever/chills. Overall fatigued. With body aches. Worried about possible COVID. Blood sugars have been running high at home. requests COVID-19 testing. Known COVID-19 contact: none. Recent travel: none. Denies: sore throat, difficulty breathing and headache. No n/v/d reported    OBJECTIVE:  Vitals:   02/24/20 1907 02/24/20 1910  BP:  (!) 114/50  Pulse:  (!) 105  Resp:  18  Temp:  (!) 101.3 F (38.5 C)  TempSrc:  Oral  SpO2:  97%  Weight: 127 kg     General appearance: alert; no distress Eyes: PERRLA; EOMI; conjunctiva normal HENT: [place]; AT; nasal congestion Neck: supple  Lungs: speaks full sentences without difficulty; unlabored; dry cough Extremities: no edema Skin: warm and dry Neurologic: normal gait Psychological: alert and cooperative; normal mood and affect  Labs:  Labs Reviewed  GLUCOSE, CAPILLARY - Abnormal; Notable for the following components:      Result Value   Glucose-Capillary 264 (*)    All other components within normal limits  CBG MONITORING, ED - Abnormal; Notable for the following components:   Glucose-Capillary 264 (*)    All other components within normal limits  POCT URINALYSIS DIP (DEVICE) - Abnormal; Notable for the following components:   Glucose, UA >=1000 (*)    All  other components within normal limits  NOVEL CORONAVIRUS, NAA (HOSP ORDER, SEND-OUT TO REF LAB; TAT 18-24 HRS)  POC SARS CORONAVIRUS 2 AG -  ED  POC SARS CORONAVIRUS 2 AG      No Known Allergies  Past Medical History:  Diagnosis Date  . Bradycardia   . Ovarian cyst    Social History   Socioeconomic History  . Marital status: Single    Spouse name: Not on file  . Number of children: Not on file  . Years of education: Not on file  . Highest education level: Not on file  Occupational History  . Not on file  Tobacco Use  .  Smoking status: Current Every Day Smoker    Types: Cigarettes  . Smokeless tobacco: Current User  Substance and Sexual Activity  . Alcohol use: No  . Drug use: No  . Sexual activity: Yes  Other Topics Concern  . Not on file  Social History Narrative  . Not on file   Social Determinants of Health   Financial Resource Strain:   . Difficulty of Paying Living Expenses: Not on file  Food Insecurity:   . Worried About Charity fundraiser in the Last Year: Not on file  . Ran Out of Food in the Last Year: Not on file  Transportation Needs:   . Lack of Transportation (Medical): Not on file  . Lack of Transportation (Non-Medical): Not on file  Physical Activity:   . Days of Exercise per Week: Not on file  . Minutes of Exercise per Session: Not on file  Stress:   . Feeling of Stress : Not on file  Social Connections:   . Frequency of Communication with Friends and Family: Not on file  . Frequency of Social Gatherings with Friends and Family: Not on file  . Attends Religious Services: Not on file  . Active Member of Clubs or Organizations: Not on file  . Attends Archivist Meetings: Not on file  . Marital Status: Not on file  Intimate Partner Violence:   . Fear of Current or Ex-Partner: Not on file  . Emotionally Abused: Not on file  . Physically Abused: Not on file  . Sexually Abused: Not on file   Family History  Problem Relation Age of Onset  . Hypertension Mother   . Diabetes Mother   . Asthma Father   . Osteoarthritis Father   . Diabetes Other   . Hypertension Other   . Stroke Other    Past Surgical History:  Procedure Laterality Date  . OTHER SURGICAL HISTORY     Thumb surgery  . THUMB ARTHROSCOPY       Vanessa Kick, MD 02/27/20 [phone]

## 2020-03-10 ENCOUNTER — Ambulatory Visit (HOSPITAL_COMMUNITY)
Admission: EM | Admit: 2020-03-10 | Discharge: 2020-03-10 | Disposition: A | Discharge status: Home / Self Care | Payer: Self-pay | Attending: Family Medicine | Admitting: Family Medicine

## 2020-03-10 ENCOUNTER — Other Ambulatory Visit: Payer: Self-pay

## 2020-03-10 ENCOUNTER — Encounter (HOSPITAL_COMMUNITY): Payer: Self-pay | Admitting: Emergency Medicine

## 2020-03-10 DIAGNOSIS — E119 Type 2 diabetes mellitus without complications: Secondary | ICD-10-CM

## 2020-03-10 DIAGNOSIS — Z720 Tobacco use: Secondary | ICD-10-CM

## 2020-03-10 DIAGNOSIS — E1169 Type 2 diabetes mellitus with other specified complication: Secondary | ICD-10-CM

## 2020-03-10 DIAGNOSIS — R739 Hyperglycemia, unspecified: Secondary | ICD-10-CM

## 2020-03-10 DIAGNOSIS — E669 Obesity, unspecified: Secondary | ICD-10-CM

## 2020-03-10 HISTORY — DX: Type 2 diabetes mellitus without complications: E11.9

## 2020-03-10 LAB — CBG MONITORING, ED
Glucose-Capillary: 161 mg/dL — ABNORMAL HIGH (ref 70–99)
Glucose-Capillary: 161 mg/dL — ABNORMAL HIGH (ref 70–99)

## 2020-03-10 LAB — GLUCOSE, CAPILLARY: Glucose-Capillary: 161 mg/dL — ABNORMAL HIGH (ref 70–99)

## 2020-03-10 MED ORDER — GLIPIZIDE ER 5 MG PO TB24: 10.0000 mg | ORAL_TABLET | Freq: Every day | ORAL | 0 refills | Status: DC

## 2020-03-10 NOTE — ED Triage Notes (Signed)
Patient reports medication has helped keep cbg readings down.  However, today woke feeling bad and checked cbg and was 416 Before coming here today cbg was 197

## 2020-03-10 NOTE — Discharge Instructions (Signed)
Continue to watch the carbohydrates in your diet Walk every day that you are able Take Metformin 1000 mg 3 times a day with food Take 10 mg of Glucotrol a day I have given you a refill of Glucotrol Follow-up with the daily health clinics Return here as needed

## 2020-03-10 NOTE — ED Provider Notes (Signed)
[place]    CSN: [id number] Arrival date & time: 03/10/20  1822      History   Chief Complaint Chief Complaint  Patient presents with  . Hyperglycemia    HPI Jennifer Arnold is a 34 y.o. female.   HPI  Past Medical History:  Diagnosis Date  . Bradycardia   . Diabetes mellitus without complication ([place])   . Ovarian cyst     There are no problems to display for this patient.   Past Surgical History:  Procedure Laterality Date  . OTHER SURGICAL HISTORY     Thumb surgery  . THUMB ARTHROSCOPY      OB History   No obstetric history on file.      Home Medications    Prior to Admission medications   Medication Sig Start Date End Date Taking? Authorizing Provider  metFORMIN (GLUCOPHAGE) 500 MG tablet Take 2 tablets (1,000 mg total) by mouth 2 (two) times daily with a meal. 02/27/20  Yes Hagler, Aaron Edelman, MD  potassium chloride (MICRO-K) 10 MEQ CR capsule Take 10 mEq by mouth 2 (two) times daily.   Yes [provider]  glipiZIDE (GLUCOTROL XL) 5 MG 24 hr tablet Take 2 tablets (10 mg total) by mouth daily with breakfast. 03/10/20   Raylene Everts, MD  Insulin Glargine (LANTUS) 100 UNIT/ML Solostar Pen Inject 10 Units into the skin daily. 02/24/20 03/10/20  Vanessa Kick, MD    Family History Family History  Problem Relation Age of Onset  . Hypertension Mother   . Diabetes Mother   . Asthma Father   . Osteoarthritis Father   . Diabetes Other   . Hypertension Other   . Stroke Other     Social History Social History   Tobacco Use  . Smoking status: Current Every Day Smoker    Types: Cigarettes  . Smokeless tobacco: Current User  Substance Use Topics  . Alcohol use: No  . Drug use: No     Allergies   Patient has no known allergies.   Review of Systems Review of Systems  Constitutional: Negative for unexpected weight change.  Endocrine: Positive for polyuria.       When sugar is high  Neurological: Positive for dizziness.      This morning with hyperglycemia of 400     Physical Exam Triage Vital Signs ED Triage Vitals  Enc Vitals Group     BP 03/10/20 1931 115/79     Pulse Rate 03/10/20 1931 81     Resp 03/10/20 1931 17     Temp 03/10/20 1931 97.9 F (36.6 C)     Temp Source 03/10/20 1931 Oral     SpO2 03/10/20 1931 98 %     Weight --      Height --      Head Circumference --      Peak Flow --      Pain Score 03/10/20 1927 0     Pain Loc --      Pain Edu? --      Excl. in [place]? --    No data found.  Updated Vital Signs BP 115/79 (BP Location: Left Arm) Comment (BP Location): large cuff  Pulse 81   Temp 97.9 F (36.6 C) (Oral)   Resp 17   LMP 02/17/2020   SpO2 98%     Physical Exam Vitals and nursing note reviewed.  Constitutional:      General: She is not in acute distress.  Appearance: She is well-developed. She is obese. She is not ill-appearing.     Comments: Exam by observation.  HENT:     Head: Normocephalic and atraumatic.     Mouth/Throat:     Comments: Mask in place Eyes:     Conjunctiva/sclera: Conjunctivae normal.     Pupils: Pupils are equal, round, and reactive to light.  Cardiovascular:     Rate and Rhythm: Normal rate.  Pulmonary:     Effort: Pulmonary effort is normal. No respiratory distress.  Musculoskeletal:        General: Normal range of motion.     Cervical back: Normal range of motion.  Skin:    General: Skin is warm and dry.  Neurological:     Mental Status: She is alert.  Psychiatric:        Mood and Affect: Mood normal.        Behavior: Behavior normal.     Comments: Polite.  Appreciative      UC Treatments / Results  Labs (all labs ordered are listed, but only abnormal results are displayed) Labs Reviewed  GLUCOSE, CAPILLARY - Abnormal; Notable for the following components:      Result Value   Glucose-Capillary 161 (*)    All other components within normal limits  CBG MONITORING, ED - Abnormal; Notable for the following components:    Glucose-Capillary 161 (*)    All other components within normal limits  CBG MONITORING, ED - Abnormal; Notable for the following components:   Glucose-Capillary 161 (*)    All other components within normal limits    EKG   Radiology No results found.  Procedures Procedures (including critical care time)  Medications Ordered in UC Medications - No data to display  Initial Impression / Assessment and Plan / UC Course  I have reviewed the triage vital signs and the nursing notes.  Pertinent labs & imaging results that were available during my care of the patient were reviewed by me and considered in my medical decision making (see chart for details).    If patient is having sugars in the 400s, I am going to increase her glipizide to 2 times a day Reinforced taking walk every day Reinforced the basics of diabetic diet Reinforced the need to have a primary care doctor and a nutrition consult   I am sending her to the outpatient primary care out reach clinics for additional care She is given a [employer] financial aid form To return as needed Final Clinical Impressions(s) / UC Diagnoses   Final diagnoses:  Hyperglycemia     Discharge Instructions     Continue to watch the carbohydrates in your diet Walk every day that you are able Take Metformin 1000 mg 3 times a day with food Take 10 mg of Glucotrol a day I have given you a refill of Glucotrol Follow-up with the daily health clinics Return here as needed   ED Prescriptions    Medication Sig Dispense Auth. Provider   glipiZIDE (GLUCOTROL XL) 5 MG 24 hr tablet Take 2 tablets (10 mg total) by mouth daily with breakfast. 180 tablet Eustace Moore, MD     PDMP not reviewed this encounter.   Eustace Moore, MD 03/10/20 Corky Crafts

## 2020-03-23 ENCOUNTER — Other Ambulatory Visit: Payer: Self-pay

## 2020-03-23 ENCOUNTER — Telehealth (HOSPITAL_COMMUNITY): Payer: Self-pay

## 2020-03-23 NOTE — Telephone Encounter (Signed)
Pt called & informed us that her blood sugar was 37, states she was unable to go to work because she did not feel good. Informed patient that she was to cut her Glipizide tabs in half & monitor for a week then return to be seen, per Hillis Range, MD.

## 2020-05-20 ENCOUNTER — Emergency Department (HOSPITAL_COMMUNITY)
Admission: EM | Admit: 2020-05-20 | Discharge: 2020-05-20 | Disposition: A | Discharge status: Home / Self Care | Payer: Self-pay | Attending: Emergency Medicine | Admitting: Emergency Medicine

## 2020-05-20 ENCOUNTER — Encounter (HOSPITAL_COMMUNITY): Payer: Self-pay | Admitting: *Deleted

## 2020-05-20 ENCOUNTER — Ambulatory Visit (HOSPITAL_COMMUNITY)
Admission: EM | Admit: 2020-05-20 | Discharge: 2020-05-20 | Disposition: A | Discharge status: Home / Self Care | Payer: Self-pay | Attending: Emergency Medicine | Admitting: Emergency Medicine

## 2020-05-20 ENCOUNTER — Other Ambulatory Visit: Payer: Self-pay

## 2020-05-20 ENCOUNTER — Encounter (HOSPITAL_COMMUNITY): Payer: Self-pay

## 2020-05-20 DIAGNOSIS — R109 Unspecified abdominal pain: Secondary | ICD-10-CM | POA: Insufficient documentation

## 2020-05-20 DIAGNOSIS — Z5321 Procedure and treatment not carried out due to patient leaving prior to being seen by health care provider: Secondary | ICD-10-CM | POA: Insufficient documentation

## 2020-05-20 DIAGNOSIS — Z760 Encounter for issue of repeat prescription: Secondary | ICD-10-CM

## 2020-05-20 LAB — CBC
HCT: 40.1 % (ref 36.0–46.0)
Hemoglobin: 12.3 g/dL (ref 12.0–15.0)
MCH: 24.3 pg — ABNORMAL LOW (ref 26.0–34.0)
MCHC: 30.7 g/dL (ref 30.0–36.0)
MCV: 79.1 fL — ABNORMAL LOW (ref 80.0–100.0)
Platelets: 282 10*3/uL (ref 150–400)
RBC: 5.07 MIL/uL (ref 3.87–5.11)
RDW: 17.7 % — ABNORMAL HIGH (ref 11.5–15.5)
WBC: 8.5 10*3/uL (ref 4.0–10.5)
nRBC: 0 % (ref 0.0–0.2)

## 2020-05-20 LAB — COMPREHENSIVE METABOLIC PANEL
ALT: 42 U/L (ref 0–44)
AST: 87 U/L — ABNORMAL HIGH (ref 15–41)
Albumin: 4 g/dL (ref 3.5–5.0)
Alkaline Phosphatase: 50 U/L (ref 38–126)
Anion gap: 12 (ref 5–15)
BUN: 20 mg/dL (ref 6–20)
CO2: 25 mmol/L (ref 22–32)
Calcium: 9.3 mg/dL (ref 8.9–10.3)
Chloride: 103 mmol/L (ref 98–111)
Creatinine, Ser: 1.06 mg/dL — ABNORMAL HIGH (ref 0.44–1.00)
GFR calc Af Amer: >60 mL/min (ref >60)
GFR calc non Af Amer: >60 mL/min (ref >60)
Glucose, Bld: 163 mg/dL — ABNORMAL HIGH (ref 70–99)
Potassium: 4.2 mmol/L (ref 3.5–5.1)
Sodium: 140 mmol/L (ref 135–145)
Total Bilirubin: 1.2 mg/dL (ref 0.3–1.2)
Total Protein: 7.1 g/dL (ref 6.5–8.1)

## 2020-05-20 LAB — URINALYSIS, ROUTINE W REFLEX MICROSCOPIC
Bilirubin Urine: NEGATIVE
Glucose, UA: NEGATIVE mg/dL
Hgb urine dipstick: NEGATIVE
Ketones, ur: NEGATIVE mg/dL
Leukocytes,Ua: NEGATIVE
Nitrite: NEGATIVE
Protein, ur: NEGATIVE mg/dL
Specific Gravity, Urine: 1.028 (ref 1.005–1.030)
pH: 6 (ref 5.0–8.0)

## 2020-05-20 LAB — LIPASE, BLOOD: Lipase: 46 U/L (ref 11–51)

## 2020-05-20 LAB — I-STAT BETA HCG BLOOD, ED (MC, WL, AP ONLY): I-stat hCG, quantitative: <5 m[IU]/mL (ref <5)

## 2020-05-20 MED ORDER — METFORMIN HCL 1000 MG PO TABS
1000.0000 mg | ORAL_TABLET | Freq: Two times a day (BID) | ORAL | 2 refills | Status: DC
Start: 2020-05-20 — End: 2021-08-13

## 2020-05-20 MED ORDER — SODIUM CHLORIDE 0.9% FLUSH: 3.0000 mL | Freq: Once | INTRAVENOUS | Status: DC

## 2020-05-20 NOTE — Discharge Instructions (Signed)
Metformin 1000 mg twice a day was prescribed with 2 refills follow-up and establish with PCP work note was given return or go to ED for worsening of symptoms

## 2020-05-20 NOTE — ED Notes (Signed)
Pt leaving AMA, stated she is feeling a lot better. Advised to return if symptoms worsen.

## 2020-05-20 NOTE — ED Triage Notes (Signed)
To ED for eval of LUQ abd pain - started sudden approx 3 hrs ago. States pain is intermittent. Pt moaning loudly in pain. States she made herself vomiting without relief. Denies diarrhea. Unknown if she has pain with urination - states she hasn't tried.

## 2020-05-20 NOTE — ED Triage Notes (Signed)
Patient here for refill of metformin and doctors note.

## 2020-05-20 NOTE — ED Provider Notes (Signed)
[facility]    CSN: [id number] Arrival date & time: 05/20/20  1941      History   Chief Complaint Chief Complaint  Patient presents with  . Medication Refill  . work note    HPI Jennifer Arnold is a 34 y.o. female.   Who presented to the urgent care with a complaint of medication refill for for diabetes. Patient reports she is taking Metformin 1000 mg twice a day. Hx of HTN x 1-2 years. Does not currently have a PCP as patient does Present..  Denies HA, vision changes, dizziness, lightheadedness, chest pain, shortness of breath, numbness or tingling in extremities, abdominal pain, changes in bowel or bladder habits.    The history is provided by the patient. No language interpreter was used.  Medication Refill   Past Medical History:  Diagnosis Date  . Bradycardia   . Diabetes mellitus without complication ([place])   . Ovarian cyst     Patient Active Problem List   Diagnosis Date Noted  . Diabetes mellitus type 2 in obese ([place]) 03/10/2020  . Morbid obesity ([place]) 03/10/2020  . Tobacco abuse 03/10/2020    Past Surgical History:  Procedure Laterality Date  . OTHER SURGICAL HISTORY     Thumb surgery  . THUMB ARTHROSCOPY      OB History   No obstetric history on file.      Home Medications    Prior to Admission medications   Medication Sig Start Date End Date Taking? Authorizing Provider  glipiZIDE (GLUCOTROL XL) 5 MG 24 hr tablet Take 2 tablets (10 mg total) by mouth daily with breakfast. 03/10/20   Raylene Everts, MD  metFORMIN (GLUCOPHAGE) 1000 MG tablet Take 1 tablet (1,000 mg total) by mouth 2 (two) times daily. 05/20/20   Reis Goga, Darrelyn Hillock, FNP  potassium chloride (MICRO-K) 10 MEQ CR capsule Take 10 mEq by mouth 2 (two) times daily.    [provider]  Insulin Glargine (LANTUS) 100 UNIT/ML Solostar Pen Inject 10 Units into the skin daily. 02/24/20 03/10/20  Vanessa Kick, MD    Family History Family History  Problem Relation Age of  Onset  . Hypertension Mother   . Diabetes Mother   . Asthma Father   . Osteoarthritis Father   . Diabetes Other   . Hypertension Other   . Stroke Other     Social History Social History   Tobacco Use  . Smoking status: Current Every Day Smoker    Types: Cigarettes  . Smokeless tobacco: Current User  Substance Use Topics  . Alcohol use: No  . Drug use: No     Allergies   Patient has no known allergies.   Review of Systems Review of Systems  Constitutional: Negative.   HENT: Negative.   Respiratory: Negative.   Cardiovascular: Negative.   Gastrointestinal: Negative.   Neurological: Negative.      Physical Exam Triage Vital Signs ED Triage Vitals  Enc Vitals Group     BP 05/20/20 2001 114/71     Pulse Rate 05/20/20 2001 80     Resp 05/20/20 2001 14     Temp 05/20/20 2001 98.7 F (37.1 C)     Temp Source 05/20/20 2001 Oral     SpO2 05/20/20 2001 98 %     Weight --      Height --      Head Circumference --      Peak Flow --      Pain Score 05/20/20  2000 0     Pain Loc --      Pain Edu? --      Excl. in GC? --    No data found.  Updated Vital Signs BP 114/71 (BP Location: Left Arm)   Pulse 80   Temp 98.7 F (37.1 C) (Oral)   Resp 14   SpO2 98%   Visual Acuity Right Eye Distance:   Left Eye Distance:   Bilateral Distance:    Right Eye Near:   Left Eye Near:    Bilateral Near:     Physical Exam Vitals and nursing note reviewed.  Constitutional:      General: She is not in acute distress.    Appearance: Normal appearance. She is normal weight. She is not ill-appearing or toxic-appearing.  HENT:     Head: Normocephalic.     Right Ear: Tympanic membrane, ear canal and external ear normal. There is no impacted cerumen.     Left Ear: Tympanic membrane, ear canal and external ear normal. There is no impacted cerumen.     Nose: Nose normal. No congestion.     Mouth/Throat:     Mouth: Mucous membranes are moist.     Pharynx: Oropharynx is  clear. No oropharyngeal exudate or posterior oropharyngeal erythema.  Cardiovascular:     Rate and Rhythm: Normal rate and regular rhythm.     Pulses: Normal pulses.     Heart sounds: Normal heart sounds. No murmur.  Pulmonary:     Effort: Pulmonary effort is normal. No respiratory distress.     Breath sounds: Normal breath sounds. No wheezing or rhonchi.  Chest:     Chest wall: No tenderness.  Neurological:     Mental Status: She is alert and oriented to person, place, and time.      UC Treatments / Results  Labs (all labs ordered are listed, but only abnormal results are displayed) Labs Reviewed - No data to display  EKG   Radiology No results found.  Procedures Procedures (including critical care time)  Medications Ordered in UC Medications - No data to display  Initial Impression / Assessment and Plan / UC Course  I have reviewed the triage vital signs and the nursing notes.  Pertinent labs & imaging results that were available during my care of the patient were reviewed by me and considered in my medical decision making (see chart for details).     Patient is stable at discharge. Metformin was refilled. Was advised to establish care with PCP.  Final Clinical Impressions(s) / UC Diagnoses   Final diagnoses:  Encounter for medication refill     Discharge Instructions     Metformin 1000 mg twice a day was prescribed with 2 refills follow-up and establish with PCP work note was given return or go to ED for worsening of symptoms    ED Prescriptions    Medication Sig Dispense Auth. Provider   metFORMIN (GLUCOPHAGE) 1000 MG tablet Take 1 tablet (1,000 mg total) by mouth 2 (two) times daily. 60 tablet Ryver Zadrozny, Zachery Dakins, FNP     PDMP not reviewed this encounter.   Durward Parcel, FNP 05/20/20 2044

## 2020-10-06 ENCOUNTER — Encounter (HOSPITAL_COMMUNITY): Payer: Self-pay

## 2020-10-06 ENCOUNTER — Other Ambulatory Visit: Payer: Self-pay

## 2020-10-06 ENCOUNTER — Ambulatory Visit (HOSPITAL_COMMUNITY)
Admission: EM | Admit: 2020-10-06 | Discharge: 2020-10-06 | Disposition: A | Discharge status: Home / Self Care | Payer: Self-pay | Attending: Internal Medicine | Admitting: Internal Medicine

## 2020-10-06 DIAGNOSIS — E1169 Type 2 diabetes mellitus with other specified complication: Secondary | ICD-10-CM | POA: Insufficient documentation

## 2020-10-06 DIAGNOSIS — E669 Obesity, unspecified: Secondary | ICD-10-CM | POA: Insufficient documentation

## 2020-10-06 LAB — CBG MONITORING, ED: Glucose-Capillary: 367 mg/dL — ABNORMAL HIGH (ref 70–99)

## 2020-10-06 LAB — HEMOGLOBIN A1C
Hgb A1c MFr Bld: 6.9 % — ABNORMAL HIGH (ref 4.8–5.6)
Mean Plasma Glucose: 151.33 mg/dL

## 2020-10-06 MED ORDER — GLIPIZIDE ER 10 MG PO TB24: 10.0000 mg | ORAL_TABLET | Freq: Every day | ORAL | 6 refills | Status: DC

## 2020-10-06 MED ORDER — INSULIN ASPART 100 UNIT/ML ~~LOC~~ SOLN: 10.0000 [IU] | Freq: Once | SUBCUTANEOUS | Status: DC

## 2020-10-06 NOTE — ED Triage Notes (Signed)
Pt presents for medication refill of glipizide: pt states she has not had any in 3 days.

## 2020-10-06 NOTE — ED Provider Notes (Signed)
[facility]    CSN: [id number] Arrival date & time: 10/06/20  4098      History   Chief Complaint Chief Complaint  Patient presents with  . Medication Refill    HPI Jennifer Arnold is a 34 y.o. female reporting to Urgent Care today needing a refill on her Glipizide for diabetes.  She has prescribed glipizide 10 mg once daily and Metformin 1000 mg twice daily.  She reports she only been taking the Metformin 1000 mg daily, but has run out of her glipizide.  She checks her blood sugar couple times a day at home and reports that her blood sugar runs around 150 or less.  The patient reports having a history of diabetes and needs to have an A1c around 6-7%, however the patient was recently imprisoned and then released in December 2020/January 2021.  She reports feeling "out of it" today and having a headache.  Point-of-care CBG 376.  -January 2021 her A1c was found to be 13%.  She was started on Metformin 500 mg twice daily. -February 2021 she was seen again for hyperglycemia, prescribed Lantus 10 units daily, however she reports never taking this medication because it was too expensive for her to afford.  Metformin was increased to 1000 mg twice daily. -March 2021 patient returned to urgent care.  She had continued Metformin 1000 mg twice daily.  She was started on glipizide 10 mg by mouth daily with breakfast.   HPI  Past Medical History:  Diagnosis Date  . Bradycardia   . Diabetes mellitus without complication ([facility])   . Ovarian cyst     Patient Active Problem List   Diagnosis Date Noted  . Diabetes mellitus type 2 in obese ([facility]) 03/10/2020  . Morbid obesity ([facility]) 03/10/2020  . Tobacco abuse 03/10/2020    Past Surgical History:  Procedure Laterality Date  . OTHER SURGICAL HISTORY     Thumb surgery  . THUMB ARTHROSCOPY      OB History   No obstetric history on file.      Home Medications    Prior to Admission medications   Medication Sig Start Date End  Date Taking? Authorizing Provider  glipiZIDE (GLUCOTROL XL) 10 MG 24 hr tablet Take 1 tablet (10 mg total) by mouth daily with breakfast. 10/06/20   Dollene Cleveland, DO  metFORMIN (GLUCOPHAGE) 1000 MG tablet Take 1 tablet (1,000 mg total) by mouth 2 (two) times daily. 05/20/20   Avegno, Zachery Dakins, FNP  potassium chloride (MICRO-K) 10 MEQ CR capsule Take 10 mEq by mouth 2 (two) times daily.    [provider]  Insulin Glargine (LANTUS) 100 UNIT/ML Solostar Pen Inject 10 Units into the skin daily. 02/24/20 03/10/20  Mardella Layman, MD    Family History Family History  Problem Relation Age of Onset  . Hypertension Mother   . Diabetes Mother   . Asthma Father   . Osteoarthritis Father   . Diabetes Other   . Hypertension Other   . Stroke Other     Social History Social History   Tobacco Use  . Smoking status: Current Every Day Smoker    Types: Cigarettes  . Smokeless tobacco: Current User  Substance Use Topics  . Alcohol use: No  . Drug use: No     Allergies   Patient has no known allergies.   Review of Systems Review of Systems -see HPI   Physical Exam Triage Vital Signs ED Triage Vitals  Enc Vitals Group  BP 10/06/20 1043 120/79     Pulse Rate 10/06/20 1043 77     Resp 10/06/20 1043 17     Temp 10/06/20 1043 98.2 F (36.8 C)     Temp Source 10/06/20 1043 Oral     SpO2 10/06/20 1043 100 %     Weight --      Height --      Head Circumference --      Peak Flow --      Pain Score 10/06/20 1045 1     Pain Loc --      Pain Edu? --      Excl. in GC? --    No data found.  Updated Vital Signs BP 120/79 (BP Location: Left Arm)   Pulse 77   Temp 98.2 F (36.8 C) (Oral)   Resp 17   LMP 10/03/2020   SpO2 100%   Physical exam: General: Well-appearing, pleasant patient Respiratory: Comfortable work of breathing, speaking complete sentences   UC Treatments / Results  Labs (all labs ordered are listed, but only abnormal results are displayed) Labs  Reviewed  HEMOGLOBIN A1C - Abnormal; Notable for the following components:      Result Value   Hgb A1c MFr Bld 6.9 (*)    All other components within normal limits  CBG MONITORING, ED - Abnormal; Notable for the following components:   Glucose-Capillary 367 (*)    All other components within normal limits  COMPREHENSIVE METABOLIC PANEL    Radiology No results found.  Procedures Procedures (including critical care time)  Medications Ordered in UC Medications  insulin aspart (novoLOG) injection 10 Units (has no administration in time range)    Initial Impression / Assessment and Plan / UC Course  I have reviewed the triage vital signs and the nursing notes.  Pertinent labs & imaging results that were available during my care of the patient were reviewed by me and considered in my medical decision making (see chart for details).   Hyperglycemia: Patient acutely hyperglycemic with blood sugar 367.  Patient takes Metformin 1000 mg twice daily as well as glipizide 10 mg once daily.  She reports, however, that she has been out of her glipizide for the last 2 days.  She has a history of A1c is elevated at 13%.  I asked the patient if we could collect CMP and BHA be to rule out diabetic ketoacidosis, I also offered her 10 units of insulin aspart to decrease her blood sugar with request to recheck her blood sugar in 1 hour, the patient respectfully declined, reported she was feeling fine and felt good enough to leave.  She stated she only wanted her glipizide so she could go home. -Strict return precautions given -Prescription for glipizide sent to the patient's pharmacy -HbA1c checked at this visit was in a good range at 6.9% -Patient also given information regarding contact information for PCP   Final Clinical Impressions(s) / UC Diagnoses   Final diagnoses:  None     Discharge Instructions     Thank you for coming in to see [place] today! Please see below to review our plan for today's  visit:  1.  Your glipizide has been refilled and sent to the pharmacy on [facility].  Take 1 tablet of glipizide 10 mg every morning with breakfast. Continue to take Metformin 1000 mg twice daily. 2.  I recommend a plant-based diet to help decrease carbohydrate intake.  Remember to eat vegetables for half of every meal  and to reduce carbohydrates (pasta, rice, breads, potatoes) to only one quarter of every meal. 3.  You can call the [facility] at [phone] to establish care with a primary care physician.  It was our pleasure to serve you!   Dr. Peggyann Shoals [facility]     ED Prescriptions    Medication Sig Dispense Auth. Provider   glipiZIDE (GLUCOTROL XL) 10 MG 24 hr tablet  (Status: Discontinued) Take 1 tablet (10 mg total) by mouth daily with breakfast. 30 tablet Peggyann Shoals C, DO   glipiZIDE (GLUCOTROL XL) 10 MG 24 hr tablet Take 1 tablet (10 mg total) by mouth daily with breakfast. 30 tablet Dollene Cleveland, DO     PDMP not reviewed this encounter.    Peggyann Shoals, DO [facility], PGY-3 10/06/2020 12:30 PM    Dollene Cleveland, DO 10/06/20 1239

## 2020-10-06 NOTE — Discharge Instructions (Addendum)
Thank you for coming in to see [place] today! Please see below to review our plan for today's visit:  1.  Your glipizide has been refilled and sent to the pharmacy on [facility].  Take 1 tablet of glipizide 10 mg every morning with breakfast. Continue to take Metformin 1000 mg twice daily. 2.  I recommend a plant-based diet to help decrease carbohydrate intake.  Remember to eat vegetables for half of every meal and to reduce carbohydrates (pasta, rice, breads, potatoes) to only one quarter of every meal. 3.  You can call the [facility] at [phone] to establish care with a primary care physician.  It was our pleasure to serve you!   Dr. Peggyann Shoals [facility]

## 2020-10-07 ENCOUNTER — Encounter (HOSPITAL_COMMUNITY): Payer: Self-pay | Admitting: Emergency Medicine

## 2020-10-07 ENCOUNTER — Other Ambulatory Visit: Payer: Self-pay

## 2020-10-07 ENCOUNTER — Ambulatory Visit (HOSPITAL_COMMUNITY)
Admission: EM | Admit: 2020-10-07 | Discharge: 2020-10-07 | Disposition: A | Discharge status: Home / Self Care | Payer: Self-pay | Attending: Internal Medicine | Admitting: Internal Medicine

## 2020-10-07 DIAGNOSIS — Z20822 Contact with and (suspected) exposure to covid-19: Secondary | ICD-10-CM | POA: Insufficient documentation

## 2020-10-07 LAB — SARS CORONAVIRUS 2 (TAT 6-24 HRS): SARS Coronavirus 2: NEGATIVE

## 2020-10-07 NOTE — ED Triage Notes (Signed)
Pt presents with COVID exposure. Denies any symptoms at this time. States was exposure.

## 2020-10-07 NOTE — Discharge Instructions (Signed)

## 2021-02-08 ENCOUNTER — Other Ambulatory Visit: Payer: Self-pay

## 2021-02-08 ENCOUNTER — Encounter (HOSPITAL_COMMUNITY): Payer: Self-pay

## 2021-02-08 ENCOUNTER — Emergency Department (HOSPITAL_COMMUNITY)
Admission: EM | Admit: 2021-02-08 | Discharge: 2021-02-08 | Disposition: A | Discharge status: Home / Self Care | Payer: Self-pay | Attending: Emergency Medicine | Admitting: Emergency Medicine

## 2021-02-08 ENCOUNTER — Emergency Department (HOSPITAL_COMMUNITY): Payer: Self-pay

## 2021-02-08 DIAGNOSIS — R2231 Localized swelling, mass and lump, right upper limb: Secondary | ICD-10-CM | POA: Insufficient documentation

## 2021-02-08 DIAGNOSIS — Z5321 Procedure and treatment not carried out due to patient leaving prior to being seen by health care provider: Secondary | ICD-10-CM | POA: Insufficient documentation
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
## 2021-08-13 ENCOUNTER — Ambulatory Visit (HOSPITAL_COMMUNITY)
Admission: EM | Admit: 2021-08-13 | Discharge: 2021-08-13 | Disposition: A | Discharge status: Home / Self Care | Payer: Self-pay | Attending: Family Medicine | Admitting: Family Medicine

## 2021-08-13 ENCOUNTER — Encounter (HOSPITAL_COMMUNITY): Payer: Self-pay | Admitting: Emergency Medicine

## 2021-08-13 ENCOUNTER — Other Ambulatory Visit: Payer: Self-pay

## 2021-08-13 DIAGNOSIS — E119 Type 2 diabetes mellitus without complications: Secondary | ICD-10-CM | POA: Insufficient documentation

## 2021-08-13 DIAGNOSIS — J069 Acute upper respiratory infection, unspecified: Secondary | ICD-10-CM

## 2021-08-13 DIAGNOSIS — U071 COVID-19: Secondary | ICD-10-CM | POA: Insufficient documentation

## 2021-08-13 DIAGNOSIS — H66002 Acute suppurative otitis media without spontaneous rupture of ear drum, left ear: Secondary | ICD-10-CM | POA: Insufficient documentation

## 2021-08-13 MED ORDER — AMOXICILLIN 875 MG PO TABS
875.0000 mg | ORAL_TABLET | Freq: Two times a day (BID) | ORAL | 0 refills | Status: DC
Start: 2021-08-13 — End: 2022-06-05

## 2021-08-13 MED ORDER — METFORMIN HCL 1000 MG PO TABS
1000.0000 mg | ORAL_TABLET | Freq: Two times a day (BID) | ORAL | 1 refills | Status: DC
Start: 2021-08-13 — End: 2022-01-10

## 2021-08-13 NOTE — ED Triage Notes (Signed)
Left ear pain, nasal congestion and throat pain that started 3 days ago.

## 2021-08-13 NOTE — ED Provider Notes (Addendum)
[facility]    CSN: [id number] Arrival date & time: 08/13/21  1724      History   Chief Complaint Chief Complaint  Patient presents with   Otalgia    HPI Jennifer Arnold is a 35 y.o. female.   Patient presenting today with 3-day history of congestion, sore throat worse on the left, mild cough, worsening left ear pain, swelling.  Denies known fever, body aches, chills, sweats, chest pain, shortness of breath, abdominal pain, nausea vomiting or diarrhea.  So far taking cough drops with minimal relief of symptoms.  No known sick contacts recently.  No known pertinent chronic medical problems.  Past Medical History:  Diagnosis Date   Bradycardia    Diabetes mellitus without complication ([facility])    Ovarian cyst     Patient Active Problem List   Diagnosis Date Noted   Diabetes mellitus type 2 in obese ([facility]) 03/10/2020   Morbid obesity ([facility]) 03/10/2020   Tobacco abuse 03/10/2020    Past Surgical History:  Procedure Laterality Date   OTHER SURGICAL HISTORY     Thumb surgery   THUMB ARTHROSCOPY      OB History   No obstetric history on file.      Home Medications    Prior to Admission medications   Medication Sig Start Date End Date Taking? Authorizing Provider  amoxicillin (AMOXIL) 875 MG tablet Take 1 tablet (875 mg total) by mouth 2 (two) times daily. 08/13/21  Yes Particia Nearing, PA-C  glipiZIDE (GLUCOTROL XL) 10 MG 24 hr tablet Take 1 tablet (10 mg total) by mouth daily with breakfast. 10/06/20  Yes Peggyann Shoals C, DO  metFORMIN (GLUCOPHAGE) 1000 MG tablet Take 1 tablet (1,000 mg total) by mouth 2 (two) times daily. 08/13/21   Particia Nearing, PA-C  potassium chloride (MICRO-K) 10 MEQ CR capsule Take 10 mEq by mouth 2 (two) times daily.    [provider]  Insulin Glargine (LANTUS) 100 UNIT/ML Solostar Pen Inject 10 Units into the skin daily. 02/24/20 03/10/20  Mardella Layman, MD    Family History Family History  Problem Relation  Age of Onset   Hypertension Mother    Diabetes Mother    Asthma Father    Osteoarthritis Father    Diabetes Other    Hypertension Other    Stroke Other     Social History Social History   Tobacco Use   Smoking status: Every Day    Types: Cigarettes   Smokeless tobacco: Current  Vaping Use   Vaping Use: Never used  Substance Use Topics   Alcohol use: No   Drug use: Yes    Types: Marijuana     Allergies   Patient has no known allergies.   Review of Systems Review of Systems Per HPI  Physical Exam Triage Vital Signs ED Triage Vitals  Enc Vitals Group     BP 08/13/21 1743 121/78     Pulse Rate 08/13/21 1743 86     Resp 08/13/21 1743 20     Temp 08/13/21 1743 98.2 F (36.8 C)     Temp Source 08/13/21 1743 Oral     SpO2 08/13/21 1743 97 %     Weight --      Height --      Head Circumference --      Peak Flow --      Pain Score 08/13/21 1738 8     Pain Loc --      Pain Edu? --  Excl. in GC? --    No data found.  Updated Vital Signs BP 121/78 (BP Location: Left Arm)   Pulse 86   Temp 98.2 F (36.8 C) (Oral)   Resp 20   LMP 08/09/2021   SpO2 97%   Visual Acuity Right Eye Distance:   Left Eye Distance:   Bilateral Distance:    Right Eye Near:   Left Eye Near:    Bilateral Near:     Physical Exam Vitals and nursing note reviewed.  Constitutional:      Appearance: Normal appearance. She is not ill-appearing.  HENT:     Head: Atraumatic.     Right Ear: Tympanic membrane and ear canal normal.     Left Ear: Ear canal normal.     Ears:     Comments: TM erythematous, edematous with purulent fluid    Mouth/Throat:     Mouth: Mucous membranes are moist.     Pharynx: Posterior oropharyngeal erythema present. No oropharyngeal exudate.     Comments: No significant tonsillar edema, exudates.  Uvula midline.  Oral airway patent Eyes:     Extraocular Movements: Extraocular movements intact.     Conjunctiva/sclera: Conjunctivae normal.     Pupils:  Pupils are equal, round, and reactive to light.  Cardiovascular:     Rate and Rhythm: Normal rate and regular rhythm.     Heart sounds: Normal heart sounds.  Pulmonary:     Effort: Pulmonary effort is normal. No respiratory distress.     Breath sounds: Normal breath sounds. No wheezing or rales.  Abdominal:     General: Bowel sounds are normal. There is no distension.     Palpations: Abdomen is soft.     Tenderness: There is no abdominal tenderness. There is no guarding.  Musculoskeletal:        General: Normal range of motion.     Cervical back: Normal range of motion and neck supple.  Skin:    General: Skin is warm and dry.  Neurological:     Mental Status: She is alert and oriented to person, place, and time.  Psychiatric:        Mood and Affect: Mood normal.        Thought Content: Thought content normal.        Judgment: Judgment normal.     UC Treatments / Results  Labs (all labs ordered are listed, but only abnormal results are displayed) Labs Reviewed  SARS CORONAVIRUS 2 (TAT 6-24 HRS)    EKG   Radiology No results found.  Procedures Procedures (including critical care time)  Medications Ordered in UC Medications - No data to display  Initial Impression / Assessment and Plan / UC Course  I have reviewed the triage vital signs and the nursing notes.  Pertinent labs & imaging results that were available during my care of the patient were reviewed by me and considered in my medical decision making (see chart for details).     Suspect underlying viral illness, COVID PCR pending.  We will treat with amoxicillin for left otitis media and await results.  Quarantine protocol reviewed, work note given.  Discussed over-the-counter symptomatic management and supportive home care.  Return for acutely worsening symptoms.  Upon discharge, patient requesting a refill on metformin.  She states she has been out for several weeks now and cannot get in with a primary care at  this time.  We will refill x2 months to give her time to get in with  primary care for labs, follow-up and med management.  Discussed dietary modifications and strict return precautions if having any issues with her blood sugars or this medication.  Final Clinical Impressions(s) / UC Diagnoses   Final diagnoses:  Non-recurrent acute suppurative otitis media of left ear without spontaneous rupture of tympanic membrane  Viral URI with cough  Type 2 diabetes mellitus without complication, without long-term current use of insulin [facility])   Discharge Instructions   None    ED Prescriptions     Medication Sig Dispense Auth. Provider   amoxicillin (AMOXIL) 875 MG tablet Take 1 tablet (875 mg total) by mouth 2 (two) times daily. 20 tablet Particia Nearing, [place]   metFORMIN (GLUCOPHAGE) 1000 MG tablet Take 1 tablet (1,000 mg total) by mouth 2 (two) times daily. 60 tablet Particia Nearing, [place]      PDMP not reviewed this encounter.   Particia Nearing, [place] 08/13/21 1759    Particia Nearing, PA-C 08/13/21 1807

## 2021-08-14 LAB — SARS CORONAVIRUS 2 (TAT 6-24 HRS): SARS Coronavirus 2: POSITIVE — AB

## 2021-10-04 IMAGING — CR DG FINGER THUMB 2+V*R*
3 series · 3 of 3 positions shown · non-contrast
Comparison: None.

CLINICAL DATA: Injury

EXAM:
RIGHT THUMB 2+V

[x finger pa right]
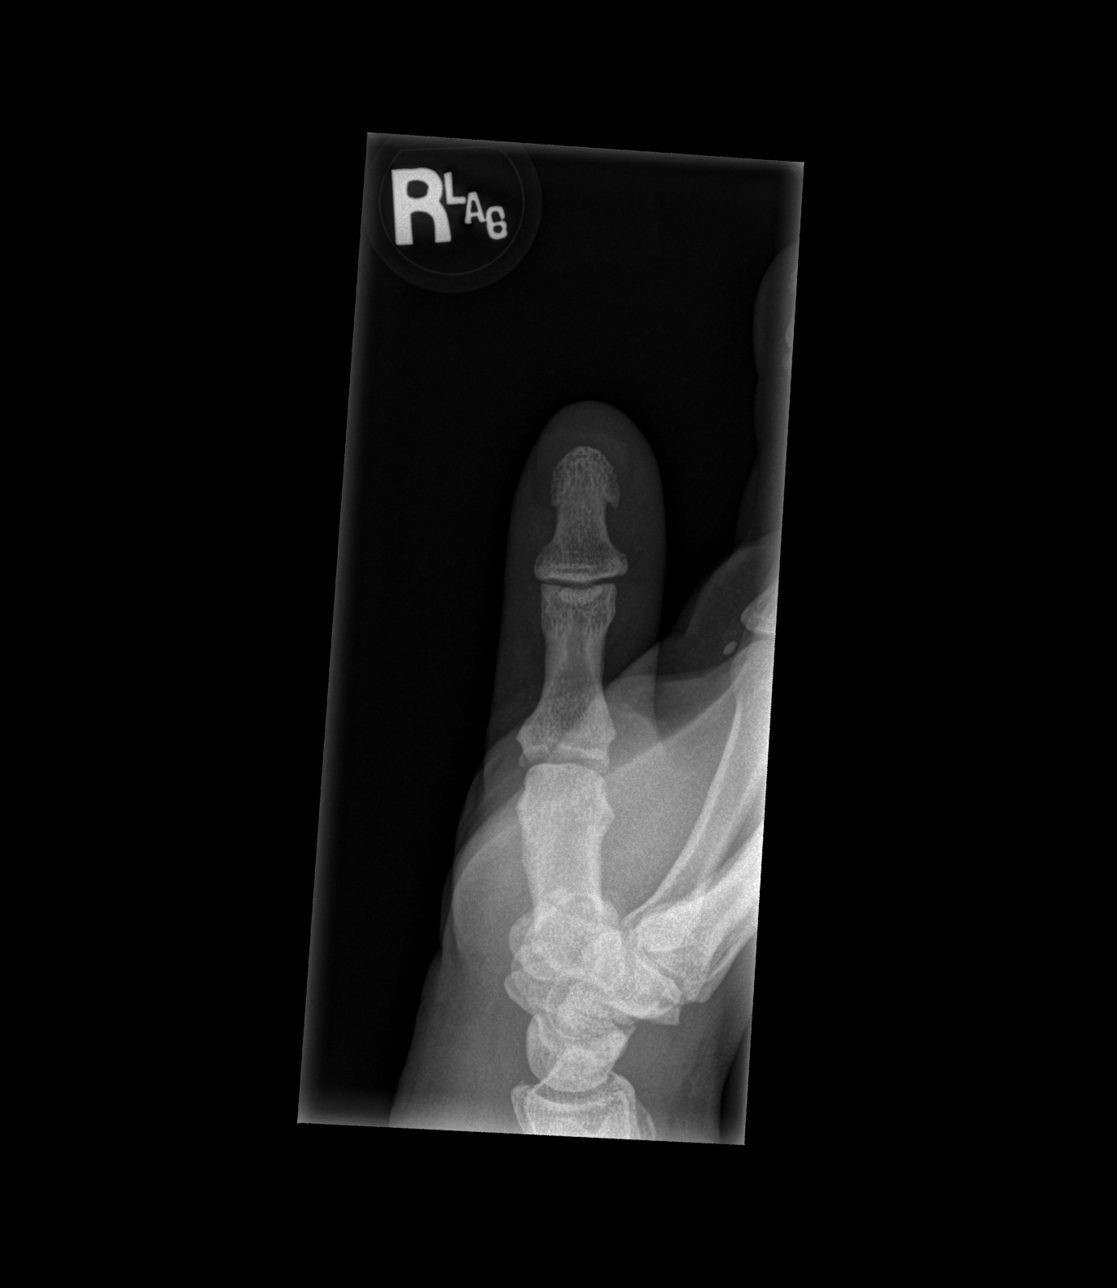

[x finger obl right]
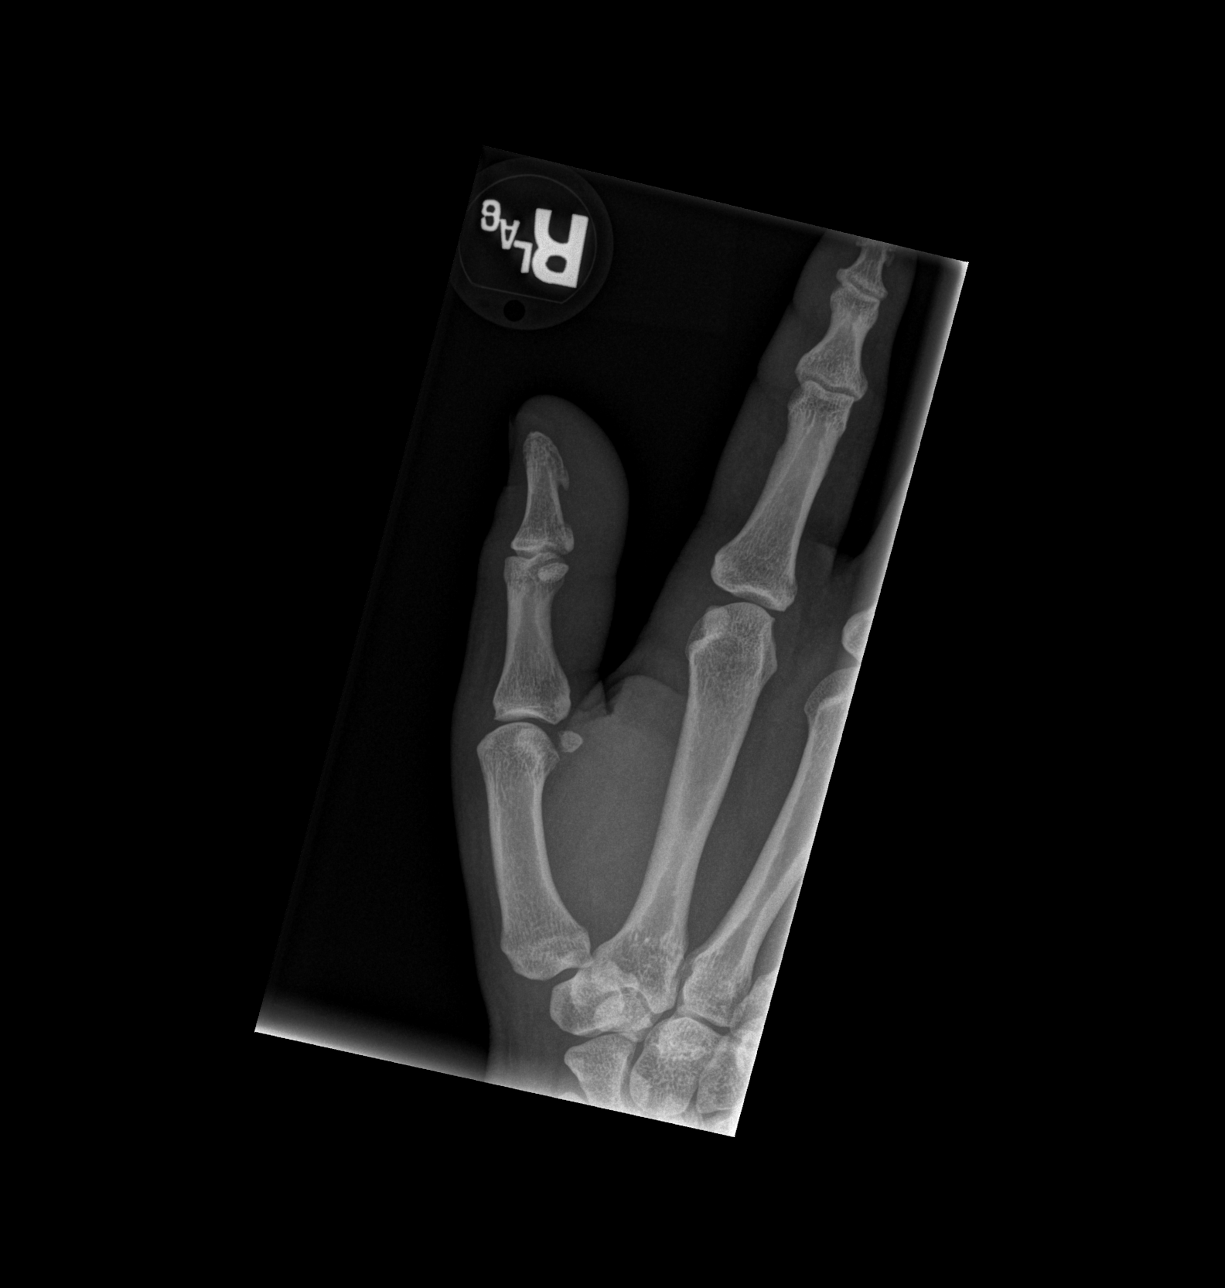

[x finger lat right]
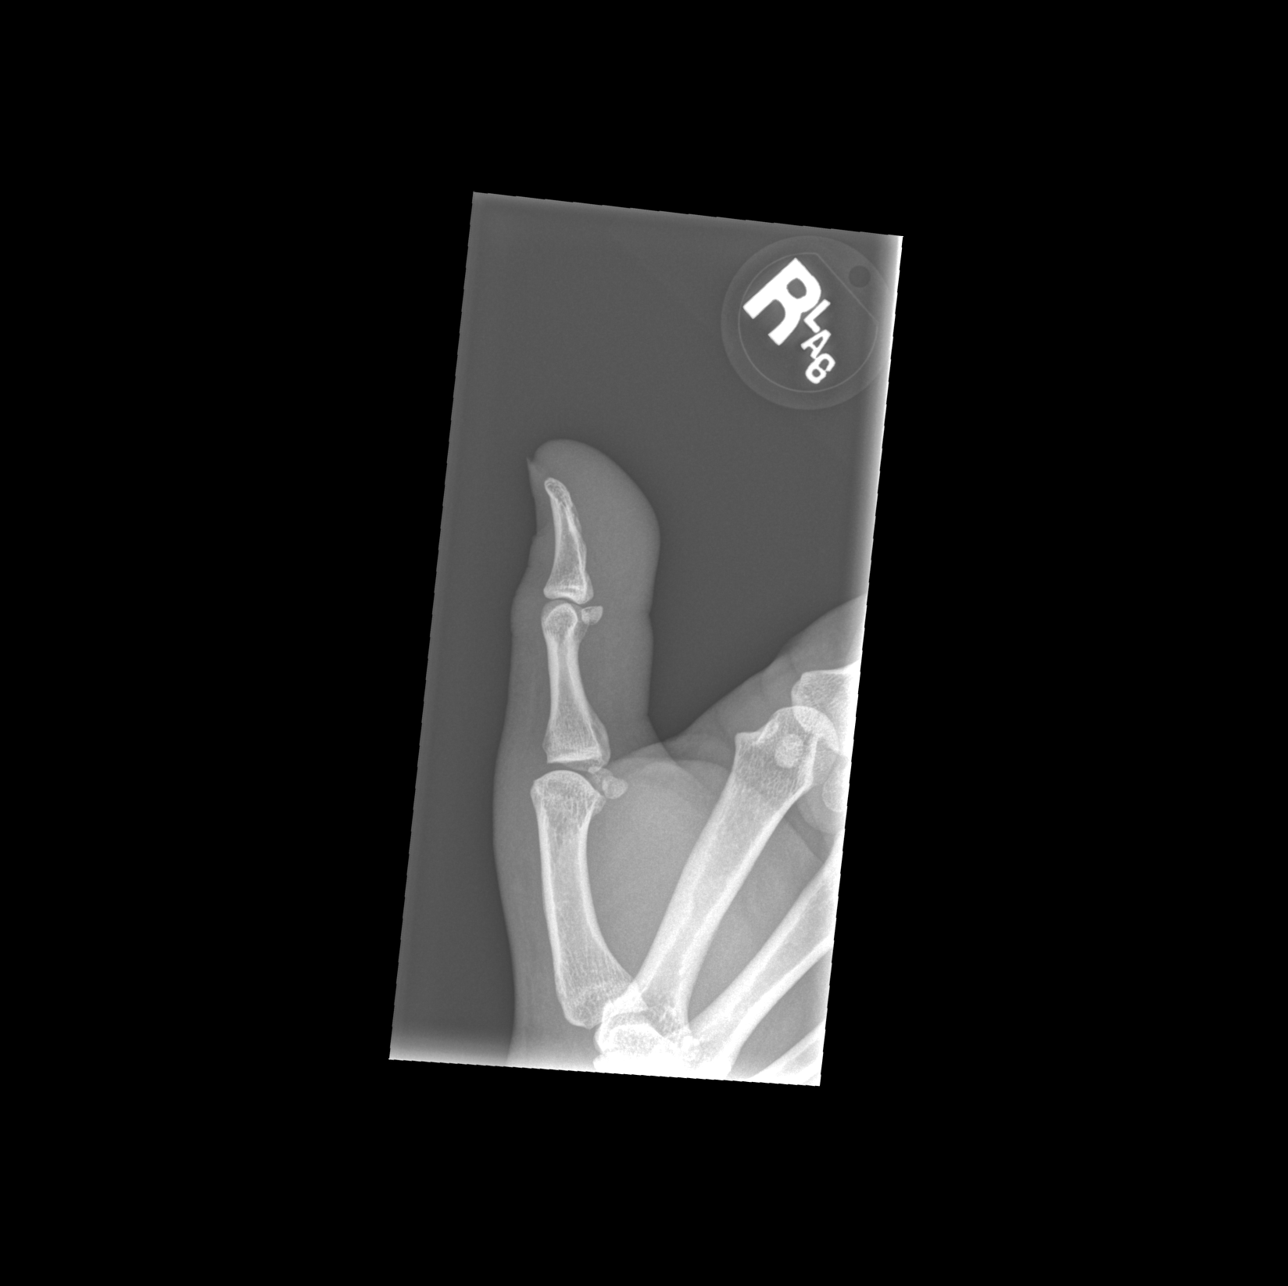

[3 of 3 positions shown; findings below may reference images not displayed]

FINDINGS: There is no evidence of fracture or dislocation. There is no
evidence of arthropathy or other focal bone abnormality. Soft
tissues are unremarkable.
IMPRESSION: Negative.

## 2022-01-10 ENCOUNTER — Ambulatory Visit (HOSPITAL_COMMUNITY)
Admission: EM | Admit: 2022-01-10 | Discharge: 2022-01-10 | Disposition: A | Discharge status: Home / Self Care | Payer: Self-pay | Attending: Emergency Medicine | Admitting: Emergency Medicine

## 2022-01-10 ENCOUNTER — Encounter (HOSPITAL_COMMUNITY): Payer: Self-pay | Admitting: Emergency Medicine

## 2022-01-10 ENCOUNTER — Other Ambulatory Visit: Payer: Self-pay

## 2022-01-10 DIAGNOSIS — E118 Type 2 diabetes mellitus with unspecified complications: Secondary | ICD-10-CM

## 2022-01-10 DIAGNOSIS — E1165 Type 2 diabetes mellitus with hyperglycemia: Secondary | ICD-10-CM

## 2022-01-10 DIAGNOSIS — E119 Type 2 diabetes mellitus without complications: Secondary | ICD-10-CM | POA: Insufficient documentation

## 2022-01-10 DIAGNOSIS — R0789 Other chest pain: Secondary | ICD-10-CM | POA: Insufficient documentation

## 2022-01-10 LAB — BASIC METABOLIC PANEL
Anion gap: 8 (ref 5–15)
BUN: 13 mg/dL (ref 6–20)
CO2: 24 mmol/L (ref 22–32)
Calcium: 8.9 mg/dL (ref 8.9–10.3)
Chloride: 103 mmol/L (ref 98–111)
Creatinine, Ser: 0.88 mg/dL (ref 0.44–1.00)
GFR, Estimated: >60 mL/min (ref >60)
Glucose, Bld: 154 mg/dL — ABNORMAL HIGH (ref 70–99)
Potassium: 4 mmol/L (ref 3.5–5.1)
Sodium: 135 mmol/L (ref 135–145)

## 2022-01-10 LAB — HEMOGLOBIN A1C
Hgb A1c MFr Bld: 6.5 % — ABNORMAL HIGH (ref 4.8–5.6)
Mean Plasma Glucose: 139.85 mg/dL

## 2022-01-10 LAB — CBG MONITORING, ED: Glucose-Capillary: 148 mg/dL — ABNORMAL HIGH (ref 70–99)

## 2022-01-10 MED ORDER — GLIPIZIDE ER 10 MG PO TB24: 10.0000 mg | ORAL_TABLET | Freq: Every day | ORAL | 2 refills | Status: DC

## 2022-01-10 MED ORDER — METFORMIN HCL 1000 MG PO TABS: 1000.0000 mg | ORAL_TABLET | Freq: Two times a day (BID) | ORAL | 2 refills | Status: DC

## 2022-01-10 NOTE — ED Provider Notes (Signed)
[place]    CSN: [id number] Arrival date & time: 01/10/22  1931      History   Chief Complaint Chief Complaint  Patient presents with   Medication Refill   Chest Pain    HPI Jennifer Arnold is a 36 y.o. female.  Patient is out of her glipizide and metformin and is requesting refills.  She is having challenging time finding a primary care provider.  She does not have insurance and does not have a consistent work.  She does check her blood sugar at home and reports that when she has her medicine it usually 90-120 before breakfast.  She does check her feet every day.  She reports a week of left-sided chest pain with palpation and movement.  She has a woodstove for heat and reports frequent lifting and moving of logs and wood.   Medication Refill Chest Pain Associated symptoms: no fever, no nausea and no shortness of breath    Past Medical History:  Diagnosis Date   Bradycardia    Diabetes mellitus without complication ([place])    Ovarian cyst     Patient Active Problem List   Diagnosis Date Noted   Diabetes mellitus type 2 in obese ([place]) 03/10/2020   Morbid obesity ([place]) 03/10/2020   Tobacco abuse 03/10/2020    Past Surgical History:  Procedure Laterality Date   OTHER SURGICAL HISTORY     Thumb surgery   THUMB ARTHROSCOPY      OB History   No obstetric history on file.      Home Medications    Prior to Admission medications   Medication Sig Start Date End Date Taking? Authorizing Provider  amoxicillin (AMOXIL) 875 MG tablet Take 1 tablet (875 mg total) by mouth 2 (two) times daily. 08/13/21   Volney American, PA-C  glipiZIDE (GLUCOTROL XL) 10 MG 24 hr tablet Take 1 tablet (10 mg total) by mouth daily with breakfast. 10/06/20   Daisy Floro, DO  metFORMIN (GLUCOPHAGE) 1000 MG tablet Take 1 tablet (1,000 mg total) by mouth 2 (two) times daily. 08/13/21   Volney American, PA-C  potassium chloride (MICRO-K) 10 MEQ CR capsule Take 10  mEq by mouth 2 (two) times daily.    [provider]  Insulin Glargine (LANTUS) 100 UNIT/ML Solostar Pen Inject 10 Units into the skin daily. 02/24/20 03/10/20  Vanessa Kick, MD    Family History Family History  Problem Relation Age of Onset   Hypertension Mother    Diabetes Mother    Asthma Father    Osteoarthritis Father    Diabetes Other    Hypertension Other    Stroke Other     Social History Social History   Tobacco Use   Smoking status: Every Day    Types: Cigarettes   Smokeless tobacco: Current  Vaping Use   Vaping Use: Never used  Substance Use Topics   Alcohol use: No   Drug use: Yes    Types: Marijuana     Allergies   Patient has no known allergies.   Review of Systems Review of Systems  Constitutional:  Negative for chills and fever.  Respiratory:  Negative for shortness of breath.   Cardiovascular:  Positive for chest pain.  Gastrointestinal:  Negative for nausea.    Physical Exam Triage Vital Signs ED Triage Vitals  Enc Vitals Group     BP 01/10/22 1939 113/71     Pulse Rate 01/10/22 1939 89     Resp  01/10/22 1939 19     Temp 01/10/22 1939 98.4 F (36.9 C)     Temp Source 01/10/22 1939 Oral     SpO2 01/10/22 1939 98 %     Weight --      Height --      Head Circumference --      Peak Flow --      Pain Score 01/10/22 1938 0     Pain Loc --      Pain Edu? --      Excl. in [place]? --    No data found.  Updated Vital Signs BP 113/71 (BP Location: Left Arm)    Pulse 89    Temp 98.4 F (36.9 C) (Oral)    Resp 19    LMP 12/27/2021    SpO2 98%   Visual Acuity Right Eye Distance:   Left Eye Distance:   Bilateral Distance:    Right Eye Near:   Left Eye Near:    Bilateral Near:     Physical Exam Constitutional:      General: She is not in acute distress.    Appearance: She is well-developed. She is obese. She is ill-appearing.  Cardiovascular:     Rate and Rhythm: Normal rate and regular rhythm.  Pulmonary:     Effort:  Pulmonary effort is normal.     Breath sounds: Normal breath sounds.  Chest:     Chest wall: Tenderness present.    Lymphadenopathy:     Upper Body:     Left upper body: No axillary adenopathy.  Neurological:     Mental Status: She is alert.     UC Treatments / Results  Labs (all labs ordered are listed, but only abnormal results are displayed) Labs Reviewed  CBG MONITORING, ED - Abnormal; Notable for the following components:      Result Value   Glucose-Capillary 148 (*)    All other components within normal limits  BASIC METABOLIC PANEL  HEMOGLOBIN A1C    EKG   Radiology No results found.  Procedures Procedures (including critical care time)  Medications Ordered in UC Medications - No data to display  Initial Impression / Assessment and Plan / UC Course  I have reviewed the triage vital signs and the nursing notes.  Pertinent labs & imaging results that were available during my care of the patient were reviewed by me and considered in my medical decision making (see chart for details).    Glucose 148.  HbA1c and BMP pending.  Gave resources for find a primary care provider.   Final Clinical Impressions(s) / UC Diagnoses   Final diagnoses:  Type 2 diabetes mellitus without complication, without long-term current use of insulin [facility])     Discharge Instructions      You might also contact the [facility] for help. [email or web address] [phone]   ED Prescriptions   None    PDMP not reviewed this encounter.   Carvel Getting, NP 01/10/22 2010

## 2022-01-10 NOTE — Discharge Instructions (Addendum)
Try the [facility] to get a primary care provider.   You might also contact the [facility] for help. [email or web address] [phone]

## 2022-01-10 NOTE — ED Triage Notes (Signed)
Pt reports that needs Glipizide and Metformin refilled due to pharmacy won't refill.  Reports pain in corner of left chest/left arm pain that intermittent over past week. Reports that been picking up logs and wood in yard.

## 2022-06-05 ENCOUNTER — Encounter (HOSPITAL_COMMUNITY): Payer: Self-pay

## 2022-06-05 ENCOUNTER — Ambulatory Visit (HOSPITAL_COMMUNITY)
Admission: EM | Admit: 2022-06-05 | Discharge: 2022-06-05 | Disposition: A | Discharge status: Home / Self Care | Payer: Self-pay | Attending: Physician Assistant | Admitting: Physician Assistant

## 2022-06-05 DIAGNOSIS — N921 Excessive and frequent menstruation with irregular cycle: Secondary | ICD-10-CM

## 2022-06-05 DIAGNOSIS — L02416 Cutaneous abscess of left lower limb: Secondary | ICD-10-CM

## 2022-06-05 LAB — POC URINE PREG, ED: Preg Test, Ur: NEGATIVE

## 2022-06-05 MED ORDER — CEPHALEXIN 500 MG PO CAPS: 500.0000 mg | ORAL_CAPSULE | Freq: Four times a day (QID) | ORAL | 0 refills | Status: DC

## 2022-06-05 MED ORDER — MEDROXYPROGESTERONE ACETATE 10 MG PO TABS: 10.0000 mg | ORAL_TABLET | Freq: Every day | ORAL | 0 refills | Status: DC

## 2022-06-05 NOTE — ED Provider Notes (Signed)
[facility]    CSN: [id number] Arrival date & time: 06/05/22  1827      History   Chief Complaint Chief Complaint  Patient presents with   Insect Bite   Menorrhagia    HPI Jennifer Arnold is a 36 y.o. female.   Pt presents with a a draining wound to her left upper thigh that started about 4-5 days ago.  She reports she has applied a warm compress and tried to drain it.  She has had drainage from the area.  Denies fever, chills, n/v/d.  She reports heavy menstrual bleeding over the last three weeks.  Reports changing pads/tampons several times per day.  Denies abdominal pain, pelvic pain, urinary sx.  She is currently not on birth control.     Past Medical History:  Diagnosis Date   Bradycardia    Diabetes mellitus without complication ([facility])    Ovarian cyst     Patient Active Problem List   Diagnosis Date Noted   Diabetes mellitus type 2 in obese ([facility]) 03/10/2020   Morbid obesity ([facility]) 03/10/2020   Tobacco abuse 03/10/2020    Past Surgical History:  Procedure Laterality Date   OTHER SURGICAL HISTORY     Thumb surgery   THUMB ARTHROSCOPY      OB History   No obstetric history on file.      Home Medications    Prior to Admission medications   Medication Sig Start Date End Date Taking? Authorizing Provider  cephALEXin (KEFLEX) 500 MG capsule Take 1 capsule (500 mg total) by mouth 4 (four) times daily. 06/05/22  Yes Ward, Tylene Fantasia, PA-C  glipiZIDE (GLUCOTROL XL) 10 MG 24 hr tablet Take 1 tablet (10 mg total) by mouth daily with breakfast. 01/10/22  Yes Kabbe, Marzella Schlein, NP  medroxyPROGESTERone (PROVERA) 10 MG tablet Take 1 tablet (10 mg total) by mouth daily for 10 days. 06/05/22 06/15/22 Yes Ward, Tylene Fantasia, PA-C  potassium chloride (MICRO-K) 10 MEQ CR capsule Take 10 mEq by mouth 2 (two) times daily.   Yes [provider]  metFORMIN (GLUCOPHAGE) 1000 MG tablet Take 1 tablet (1,000 mg total) by mouth 2 (two) times daily. 01/10/22   Cathlyn Parsons,  NP  Insulin Glargine (LANTUS) 100 UNIT/ML Solostar Pen Inject 10 Units into the skin daily. 02/24/20 03/10/20  Mardella Layman, MD    Family History Family History  Problem Relation Age of Onset   Hypertension Mother    Diabetes Mother    Asthma Father    Osteoarthritis Father    Diabetes Other    Hypertension Other    Stroke Other     Social History Social History   Tobacco Use   Smoking status: Every Day    Types: Cigarettes   Smokeless tobacco: Current  Vaping Use   Vaping Use: Never used  Substance Use Topics   Alcohol use: No   Drug use: Yes    Types: Marijuana     Allergies   Patient has no known allergies.   Review of Systems Review of Systems  Constitutional:  Negative for chills and fever.  HENT:  Negative for ear pain and sore throat.   Eyes:  Negative for pain and visual disturbance.  Respiratory:  Negative for cough and shortness of breath.   Cardiovascular:  Negative for chest pain and palpitations.  Gastrointestinal:  Negative for abdominal pain and vomiting.  Genitourinary:  Positive for menstrual problem and vaginal bleeding. Negative for dysuria and hematuria.  Musculoskeletal:  Negative for arthralgias and back pain.  Skin:  Positive for wound. Negative for color change and rash.  Neurological:  Negative for seizures and syncope.  All other systems reviewed and are negative.    Physical Exam Triage Vital Signs ED Triage Vitals  Enc Vitals Group     BP 06/05/22 1839 117/80     Pulse Rate 06/05/22 1839 96     Resp 06/05/22 1839 16     Temp 06/05/22 1839 99.4 F (37.4 C)     Temp Source 06/05/22 1839 Oral     SpO2 06/05/22 1839 94 %     Weight 06/05/22 1843 270 lb (122.5 kg)     Height 06/05/22 1843 5\' 9"  (1.753 m)     Head Circumference --      Peak Flow --      Pain Score 06/05/22 1843 7     Pain Loc --      Pain Edu? --      Excl. in [place]? --    No data found.  Updated Vital Signs BP 117/80 (BP Location: Right Arm)   Pulse 96    Temp 99.4 F (37.4 C) (Oral)   Resp 16   Ht 5\' 9"  (1.753 m)   Wt 270 lb (122.5 kg)   LMP 05/15/2022 (Approximate)   SpO2 94%   BMI 39.87 kg/m   Visual Acuity Right Eye Distance:   Left Eye Distance:   Bilateral Distance:    Right Eye Near:   Left Eye Near:    Bilateral Near:     Physical Exam Vitals and nursing note reviewed.  Constitutional:      General: She is not in acute distress.    Appearance: She is well-developed.  HENT:     Head: Normocephalic and atraumatic.  Eyes:     Conjunctiva/sclera: Conjunctivae normal.  Cardiovascular:     Rate and Rhythm: Normal rate and regular rhythm.     Heart sounds: No murmur heard. Pulmonary:     Effort: Pulmonary effort is normal. No respiratory distress.     Breath sounds: Normal breath sounds.  Abdominal:     Palpations: Abdomen is soft.     Tenderness: There is no abdominal tenderness.  Musculoskeletal:        General: No swelling.     Cervical back: Neck supple.  Skin:    General: Skin is warm and dry.     Capillary Refill: Capillary refill takes less than 2 seconds.       Neurological:     Mental Status: She is alert.  Psychiatric:        Mood and Affect: Mood normal.      UC Treatments / Results  Labs (all labs ordered are listed, but only abnormal results are displayed) Labs Reviewed  POC URINE PREG, ED    EKG   Radiology No results found.  Procedures Procedures (including critical care time)  Medications Ordered in UC Medications - No data to display  Initial Impression / Assessment and Plan / UC Course  I have reviewed the triage vital signs and the nursing notes.  Pertinent labs & imaging results that were available during my care of the patient were reviewed by me and considered in my medical decision making (see chart for details).     Given abscess is draining no need for I&D in clinic today.  Antibiotic prescribed.  Advised continued warm compress.  Return precautions discussed.    Given three weeks of  heavy menstrual bleeding will start provera.  Negative pregnancy test. Advised follow up with OBGYN.  Clinic information given.  ED precautions given.  Final Clinical Impressions(s) / UC Diagnoses   Final diagnoses:  Abscess of left leg  Menorrhagia with irregular cycle     Discharge Instructions      Take Provera for 10 days as prescribed for heavy menstrual bleeding. Follow up with OBGYN. Take antibiotic as prescribed. Apply warm compress to abscess several times per day, keep clean and dry. Return if symptoms do not improve or become worse.     ED Prescriptions     Medication Sig Dispense Auth. Provider   cephALEXin (KEFLEX) 500 MG capsule Take 1 capsule (500 mg total) by mouth 4 (four) times daily. 20 capsule Ward, Tylene Fantasia, PA-C   medroxyPROGESTERone (PROVERA) 10 MG tablet Take 1 tablet (10 mg total) by mouth daily for 10 days. 10 tablet Ward, Tylene Fantasia, PA-C      PDMP not reviewed this encounter.   Ward, Tylene Fantasia, PA-C 06/26/22 1417

## 2022-06-05 NOTE — ED Triage Notes (Signed)
On the left upper thigh, onset 4-5 days ago. Patient states it was first a pimple she popped, milky/brown discharge. Patient states it now has a white head.  Upper thigh is red and painful.   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   Patient having a heavy period for the last 3 weeks.

## 2022-06-05 NOTE — Discharge Instructions (Signed)
Take Provera for 10 days as prescribed for heavy menstrual bleeding. Follow up with OBGYN. Take antibiotic as prescribed. Apply warm compress to abscess several times per day, keep clean and dry. Return if symptoms do not improve or become worse.

## 2022-07-23 ENCOUNTER — Encounter (HOSPITAL_COMMUNITY): Payer: Self-pay | Admitting: Emergency Medicine

## 2022-07-23 ENCOUNTER — Emergency Department (HOSPITAL_COMMUNITY)
Admission: EM | Admit: 2022-07-23 | Discharge: 2022-07-23 | Disposition: A | Discharge status: Home / Self Care | Payer: Self-pay | Attending: Emergency Medicine | Admitting: Emergency Medicine

## 2022-07-23 ENCOUNTER — Emergency Department (HOSPITAL_COMMUNITY): Payer: Self-pay

## 2022-07-23 DIAGNOSIS — Z794 Long term (current) use of insulin: Secondary | ICD-10-CM | POA: Insufficient documentation

## 2022-07-23 DIAGNOSIS — E119 Type 2 diabetes mellitus without complications: Secondary | ICD-10-CM | POA: Insufficient documentation

## 2022-07-23 DIAGNOSIS — Z7984 Long term (current) use of oral hypoglycemic drugs: Secondary | ICD-10-CM | POA: Insufficient documentation

## 2022-07-23 DIAGNOSIS — D649 Anemia, unspecified: Secondary | ICD-10-CM

## 2022-07-23 DIAGNOSIS — R519 Headache, unspecified: Secondary | ICD-10-CM

## 2022-07-23 DIAGNOSIS — R55 Syncope and collapse: Secondary | ICD-10-CM

## 2022-07-23 LAB — CBC
HCT: 30.4 % — ABNORMAL LOW (ref 36.0–46.0)
Hemoglobin: 8.6 g/dL — ABNORMAL LOW (ref 12.0–15.0)
MCH: 20.9 pg — ABNORMAL LOW (ref 26.0–34.0)
MCHC: 28.3 g/dL — ABNORMAL LOW (ref 30.0–36.0)
MCV: 73.8 fL — ABNORMAL LOW (ref 80.0–100.0)
Platelets: 370 10*3/uL (ref 150–400)
RBC: 4.12 MIL/uL (ref 3.87–5.11)
RDW: 18.8 % — ABNORMAL HIGH (ref 11.5–15.5)
WBC: 8 10*3/uL (ref 4.0–10.5)
nRBC: 0 % (ref 0.0–0.2)

## 2022-07-23 LAB — URINALYSIS, ROUTINE W REFLEX MICROSCOPIC
Bilirubin Urine: NEGATIVE
Glucose, UA: NEGATIVE mg/dL
Hgb urine dipstick: NEGATIVE
Ketones, ur: NEGATIVE mg/dL
Nitrite: NEGATIVE
Protein, ur: 30 mg/dL — AB
Specific Gravity, Urine: 1.023 (ref 1.005–1.030)
pH: 5 (ref 5.0–8.0)

## 2022-07-23 LAB — BASIC METABOLIC PANEL
Anion gap: 7 (ref 5–15)
BUN: 12 mg/dL (ref 6–20)
CO2: 21 mmol/L — ABNORMAL LOW (ref 22–32)
Calcium: 9.1 mg/dL (ref 8.9–10.3)
Chloride: 107 mmol/L (ref 98–111)
Creatinine, Ser: 1.06 mg/dL — ABNORMAL HIGH (ref 0.44–1.00)
GFR, Estimated: >60 mL/min (ref >60)
Glucose, Bld: 165 mg/dL — ABNORMAL HIGH (ref 70–99)
Potassium: 4.1 mmol/L (ref 3.5–5.1)
Sodium: 135 mmol/L (ref 135–145)

## 2022-07-23 LAB — I-STAT BETA HCG BLOOD, ED (MC, WL, AP ONLY): I-stat hCG, quantitative: <5 m[IU]/mL (ref <5)

## 2022-07-23 LAB — CBG MONITORING, ED: Glucose-Capillary: 158 mg/dL — ABNORMAL HIGH (ref 70–99)

## 2022-07-23 MED ORDER — CEPHALEXIN 500 MG PO CAPS: 500.0000 mg | ORAL_CAPSULE | Freq: Three times a day (TID) | ORAL | 0 refills | Status: AC

## 2022-07-23 MED ORDER — METFORMIN HCL 1000 MG PO TABS: 1000.0000 mg | ORAL_TABLET | Freq: Two times a day (BID) | ORAL | 2 refills | Status: DC

## 2022-07-23 MED ORDER — GLIPIZIDE ER 10 MG PO TB24: 10.0000 mg | ORAL_TABLET | Freq: Every day | ORAL | 2 refills | Status: DC

## 2022-07-23 MED ORDER — FERROUS SULFATE 325 (65 FE) MG PO TABS: 325.0000 mg | ORAL_TABLET | Freq: Every day | ORAL | 0 refills | Status: DC

## 2022-07-23 NOTE — ED Provider Triage Note (Signed)
Emergency Medicine Provider Triage Evaluation Note  Jennifer Arnold , a 36 y.o. female  was evaluated in triage.  Pt complains of headache x2 weeks and a near syncopal episode that occurred earlier today.  Patient states her headache feels behind her eyes and seems to rotate between which eye.  She has tried Tylenol Motrin at home without improvement in symptoms.  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  Symptoms improved after she sat down and rested.  She does report feeling sick with vomiting last week.  Review of Systems  Positive: Headache, near syncope Negative: Numbness, tingling, weakness  Physical Exam  BP 124/64 (BP Location: Right Arm)   Pulse 77   Temp 98.4 F (36.9 C) (Oral)   Resp 18   LMP 07/09/2022   SpO2 97%  Gen:   Awake, no distress   Resp:  Normal effort  MSK:   Moves extremities without difficulty  Other: Strong and equal grip strength bilaterally.  Eyes PERRL   Medical Decision Making  Medically screening exam initiated at 3:11 PM.  Appropriate orders placed.  Asa Saunas was informed that the remainder of the evaluation will be completed by another provider, this initial triage assessment does not replace that evaluation, and the importance of remaining in the ED until their evaluation is complete.     Janell Quiet, [place] 07/23/22 1512

## 2022-07-23 NOTE — ED Notes (Signed)
Patient ambulated  to restroom without assistance.

## 2022-07-23 NOTE — ED Triage Notes (Signed)
Per EMS, patient from work, near syncopal episode, sat herself on the ground. Clammy on EMS arrival. Reports PO intake today. Does report intermittent headache x2 weeks.

## 2022-07-23 NOTE — ED Provider Notes (Signed)
[facility] Provider Note   CSN: [id number] Arrival date & time: 07/23/22  1442     History {Add pertinent medical, surgical, social history, OB history to HPI:1} Chief Complaint  Patient presents with   Near Syncope   Headache    Jennifer Arnold is a 36 y.o. female.  HPI     36 year old female with a history of diabetes presents with concern for headache and near syncopal episode.  Her headache feels behind her eyes and seems to rotate between each eye.  She tried Tylenol Motrin at home without improvement.  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  Her symptoms improved after she got down and rested.  She did have nausea and vomiting this week.  Hx of irregular menses, will sometimes be heavy Has not been bleeding for 2 weeks, but before that was going through 18 tampons in 2 days  Hemorrhoid on edge, 1 week ago, had bright red blood on outside, no blood in stool, no black stool  No hx of headaches, other than when sugar was high. Headache waxing and waning, more severe earlier today, was working hard, sweating at work. Then felt lightheaded, then took a break, then felt shaky and vision started to blur, felt like going to pass out, then called ambulance, rested, felt better.   830-9AM.  Will feel pressure behind eyes, has only felt that with high sugars.   Nausea and vomiting this week, had eaten a french fry and started feeling sick, felt like about to vomit.  One time.  No cp or dyspnea, no fevers or urinary symptoms    Past Medical History:  Diagnosis Date   Bradycardia    Diabetes mellitus without complication ([facility])    Ovarian cyst     Home Medications Prior to Admission medications   Medication Sig Start Date End Date Taking? Authorizing Provider  cephALEXin (KEFLEX) 500 MG capsule Take 1 capsule (500 mg total) by mouth 4 (four) times daily. 06/05/22   Ward, Tylene Fantasia, PA-C  glipiZIDE  (GLUCOTROL XL) 10 MG 24 hr tablet Take 1 tablet (10 mg total) by mouth daily with breakfast. 01/10/22   Cathlyn Parsons, NP  medroxyPROGESTERone (PROVERA) 10 MG tablet Take 1 tablet (10 mg total) by mouth daily for 10 days. 06/05/22 06/15/22  Ward, Tylene Fantasia, PA-C  metFORMIN (GLUCOPHAGE) 1000 MG tablet Take 1 tablet (1,000 mg total) by mouth 2 (two) times daily. 01/10/22   Cathlyn Parsons, NP  potassium chloride (MICRO-K) 10 MEQ CR capsule Take 10 mEq by mouth 2 (two) times daily.    [provider]  Insulin Glargine (LANTUS) 100 UNIT/ML Solostar Pen Inject 10 Units into the skin daily. 02/24/20 03/10/20  Mardella Layman, MD      Allergies    Patient has no known allergies.    Review of Systems   Review of Systems  Physical Exam Updated Vital Signs BP 130/62   Pulse 65   Temp 97.9 F (36.6 C)   Resp 16   Ht 5\' 9"  (1.753 m)   Wt 122.5 kg   LMP 07/09/2022   SpO2 100%   BMI 39.87 kg/m  Physical Exam  ED Results / Procedures / Treatments   Labs (all labs ordered are listed, but only abnormal results are displayed) Labs Reviewed  BASIC METABOLIC PANEL - Abnormal; Notable for the following components:      Result Value   CO2 21 (*)  Glucose, Bld 165 (*)    Creatinine, Ser 1.06 (*)    All other components within normal limits  CBC - Abnormal; Notable for the following components:   Hemoglobin 8.6 (*)    HCT 30.4 (*)    MCV 73.8 (*)    MCH 20.9 (*)    MCHC 28.3 (*)    RDW 18.8 (*)    All other components within normal limits  CBG MONITORING, ED - Abnormal; Notable for the following components:   Glucose-Capillary 158 (*)    All other components within normal limits  URINALYSIS, ROUTINE W REFLEX MICROSCOPIC  I-STAT BETA HCG BLOOD, ED ([facility], [facility], [facility] ONLY)    EKG None  Radiology No results found.  Procedures Procedures  {Document cardiac monitor, telemetry assessment procedure when appropriate:1}  Medications Ordered in ED Medications - No data to display  ED  Course/ Medical Decision Making/ A&P                           Medical Decision Making Amount and/or Complexity of Data Reviewed Labs: ordered. Radiology: ordered.   ***  Labs were obtained and were personally evaluated by me which showed a hemoglobin decreased to 8.6 from previous 2 years ago of 12.3.  BMP showed normal electrolytes, normal GFR.  I-STAT hCG is negative.  EKG was personally evaluated and interpreted by me and showed a normal sinus rhythm, without signs of delta wave, hypertrophic cardiomyopathy, epsilon waves, or significant QTc prolongation with a QTc of 453.  {Document critical care time when appropriate:1} {Document review of labs and clinical decision tools ie heart score, Chads2Vasc2 etc:1}  {Document your independent review of radiology images, and any outside records:1} {Document your discussion with family members, caretakers, and with consultants:1} {Document social determinants of health affecting pt's care:1} {Document your decision making why or why not admission, treatments were needed:1} Final Clinical Impression(s) / ED Diagnoses Final diagnoses:  None    Rx / DC Orders ED Discharge Orders     None

## 2022-07-25 ENCOUNTER — Telehealth: Payer: Self-pay

## 2022-07-25 NOTE — Telephone Encounter (Signed)
RNCM spoke with patient re: PCP needs. Patient reports today is her birthday and she needs to check with her mother to figure a good day for her to schedule a PCP appt. This RNCM advised I could call back to leave a voicemail since pt unable to write anything down. Patient reports she does not have a voicemail. This RNCM encouraged patient to call to schedule PCP appt on her own once she determines when she can be seen.

## 2023-04-20 ENCOUNTER — Other Ambulatory Visit: Payer: Self-pay

## 2023-04-20 ENCOUNTER — Emergency Department (HOSPITAL_COMMUNITY)
Admission: EM | Admit: 2023-04-20 | Discharge: 2023-04-20 | Disposition: A | Discharge status: Home / Self Care | Attending: Emergency Medicine | Admitting: Emergency Medicine

## 2023-04-20 ENCOUNTER — Encounter (HOSPITAL_COMMUNITY): Payer: Self-pay

## 2023-04-20 ENCOUNTER — Emergency Department (HOSPITAL_COMMUNITY)

## 2023-04-20 DIAGNOSIS — Z794 Long term (current) use of insulin: Secondary | ICD-10-CM | POA: Insufficient documentation

## 2023-04-20 DIAGNOSIS — E119 Type 2 diabetes mellitus without complications: Secondary | ICD-10-CM | POA: Insufficient documentation

## 2023-04-20 DIAGNOSIS — Z7984 Long term (current) use of oral hypoglycemic drugs: Secondary | ICD-10-CM | POA: Insufficient documentation

## 2023-04-20 DIAGNOSIS — R0789 Other chest pain: Secondary | ICD-10-CM | POA: Insufficient documentation

## 2023-04-20 DIAGNOSIS — R079 Chest pain, unspecified: Secondary | ICD-10-CM

## 2023-04-20 LAB — CBC
HCT: 44.8 % (ref 36.0–46.0)
Hemoglobin: 15.4 g/dL — ABNORMAL HIGH (ref 12.0–15.0)
MCH: 30.6 pg (ref 26.0–34.0)
MCHC: 34.4 g/dL (ref 30.0–36.0)
MCV: 88.9 fL (ref 80.0–100.0)
Platelets: 269 10*3/uL (ref 150–400)
RBC: 5.04 MIL/uL (ref 3.87–5.11)
RDW: 13.3 % (ref 11.5–15.5)
WBC: 5.7 10*3/uL (ref 4.0–10.5)
nRBC: 0 % (ref 0.0–0.2)

## 2023-04-20 LAB — BASIC METABOLIC PANEL
Anion gap: 12 (ref 5–15)
BUN: 13 mg/dL (ref 6–20)
CO2: 22 mmol/L (ref 22–32)
Calcium: 9.9 mg/dL (ref 8.9–10.3)
Chloride: 98 mmol/L (ref 98–111)
Creatinine, Ser: 0.87 mg/dL (ref 0.44–1.00)
GFR, Estimated: >60 mL/min (ref >60)
Glucose, Bld: 339 mg/dL — ABNORMAL HIGH (ref 70–99)
Potassium: 3.8 mmol/L (ref 3.5–5.1)
Sodium: 132 mmol/L — ABNORMAL LOW (ref 135–145)

## 2023-04-20 LAB — I-STAT VENOUS BLOOD GAS, ED
Acid-base deficit: 1 mmol/L (ref 0.0–2.0)
Bicarbonate: 23.1 mmol/L (ref 20.0–28.0)
Calcium, Ion: 1.16 mmol/L (ref 1.15–1.40)
HCT: 37 % (ref 36.0–46.0)
Hemoglobin: 12.6 g/dL (ref 12.0–15.0)
O2 Saturation: 90 %
Potassium: 3.9 mmol/L (ref 3.5–5.1)
Sodium: 135 mmol/L (ref 135–145)
TCO2: 24 mmol/L (ref 22–32)
pCO2, Ven: 35.8 mmHg — ABNORMAL LOW (ref 44–60)
pH, Ven: 7.417 (ref 7.25–7.43)
pO2, Ven: 58 mmHg — ABNORMAL HIGH (ref 32–45)

## 2023-04-20 LAB — TROPONIN I (HIGH SENSITIVITY)
Troponin I (High Sensitivity): <2 ng/L (ref <18)
Troponin I (High Sensitivity): <2 ng/L (ref <18)

## 2023-04-20 MED ORDER — ASPIRIN 81 MG PO CHEW
324.0000 mg | CHEWABLE_TABLET | Freq: Once | ORAL | Status: AC
  Administered 2023-04-20: 324 mg via ORAL
  Filled 2023-04-20: qty 4

## 2023-04-20 NOTE — ED Notes (Signed)
AVS reviewed with pt prior to discharge. Pt verbalizes understanding. Belongings with pt upon depart. Pt taken back to [facility] in [employer] custody. VSS.

## 2023-04-20 NOTE — ED Provider Notes (Signed)
[place] [facility] DE[facility]MENT AT [facility] Provider Note   CSN: [id number] Arrival date & time: 04/20/23  0756     History  Chief Complaint  Patient presents with   Chest Pain    Jennifer Arnold is a 37 y.o. female with past medical history of type 2 diabetes who presents to the ED for evaluation of chest pain.  She says that 1 week ago she had an episode of left-sided chest pain that radiated down the left arm.  She was okay until last night when she had another brief episode of the same chest pain.  It onset again this morning and has been persistent. Previous 2 episodes resolved with movement. She denies associated diaphoresis, nausea, vomiting, shortness of breath, abdominal pain, or syncope. Pt currently in [facility] where she states she has been for 8 months. Pain is not exertional.  She denies a history of this pain.  She states that before she did have some bradycardia which was attributed to potassium use and this was discontinued and she has had no issues with it since.  She denies lower extremity edema.  She denies recent surgery or history of DVT/PE.  She reports that prior to becoming incarcerated and her blood sugars were under good control but since she has been in jail this day in the 300s daily.  With that, she has been started on insulin daily.  No history of DKA/HHS.  She denies nausea, vomiting, diarrhea, abdominal pain, or other acute complaints at this time.  She denies family history of ACS.      Home Medications Glipizide Metformin Insulin before meals  Allergies    Patient has no known allergies.    Review of Systems   Review of Systems  All other systems reviewed and are negative.   Physical Exam Updated Vital Signs BP 123/71 (BP Location: Right Arm)   Pulse 62   Temp 99.5 F (37.5 C) (Oral)   Resp 18   Ht  (1.753 m)   Wt 134.3 kg   LMP 02/10/2023 (Exact Date)   SpO2 100%   BMI 43.71 kg/m  Physical Exam Vitals and  nursing note reviewed.  Constitutional:      General: She is not in acute distress.    Appearance: Normal appearance. She is not ill-appearing, toxic-appearing or diaphoretic.  HENT:     Head: Normocephalic and atraumatic.     Mouth/Throat:     Mouth: Mucous membranes are moist.  Eyes:     Extraocular Movements: Extraocular movements intact.     Conjunctiva/sclera: Conjunctivae normal.     Pupils: Pupils are equal, round, and reactive to light.  Neck:     Vascular: No JVD.  Cardiovascular:     Rate and Rhythm: Normal rate and regular rhythm.     Heart sounds: Normal heart sounds. No murmur heard.    No systolic murmur is present.     No diastolic murmur is present.     No friction rub. No gallop. No S3 or S4 sounds.  Pulmonary:     Effort: Pulmonary effort is normal. No tachypnea, accessory muscle usage or respiratory distress.     Breath sounds: Normal breath sounds. No stridor. No decreased breath sounds, wheezing, rhonchi or rales.  Chest:     Chest wall: Tenderness (point lateral to left sternum, reproduces complaint) present.  Abdominal:     General: Abdomen is flat.     Palpations: Abdomen is soft. There is  no mass.     Tenderness: There is no abdominal tenderness. There is no guarding or rebound.  Musculoskeletal:        General: Normal range of motion.     Cervical back: Normal range of motion and neck supple.     Right lower leg: No tenderness. No edema.     Left lower leg: No tenderness. No edema.  Skin:    General: Skin is warm and dry.     Capillary Refill: Capillary refill takes less than 2 seconds.     Coloration: Skin is not cyanotic or pale.     Nails: There is no clubbing.  Neurological:     General: No focal deficit present.     Mental Status: She is alert and oriented to person, place, and time. Mental status is at baseline.  Psychiatric:        Mood and Affect: Mood normal.        Behavior: Behavior normal.     ED Results / Procedures / Treatments    Labs (all labs ordered are listed, but only abnormal results are displayed) Labs Reviewed  BASIC METABOLIC PANEL - Abnormal; Notable for the following components:      Result Value   Sodium 132 (*)    Glucose, Bld 339 (*)    All other components within normal limits  CBC - Abnormal; Notable for the following components:   Hemoglobin 15.4 (*)    All other components within normal limits  I-STAT VENOUS BLOOD GAS, ED - Abnormal; Notable for the following components:   pCO2, Ven 35.8 (*)    pO2, Ven 58 (*)    All other components within normal limits  TROPONIN I (HIGH SENSITIVITY)  TROPONIN I (HIGH SENSITIVITY)    EKG EKG Interpretation  Date/Time:  Saturday April 20 2023 09:53:06 EDT Ventricular Rate:  63 PR Interval:  198 QRS Duration: 102 QT Interval:  422 QTC Calculation: 432 R Axis:   58 Text Interpretation: Sinus rhythm Confirmed by Kristine Royal [phone]) on 04/20/2023 9:54:33 AM  Radiology DG Chest Port 1 View  Result Date: 04/20/2023 CLINICAL DATA:  chest pain EXAM: PORTABLE CHEST - 1 VIEW COMPARISON:  None Available. FINDINGS: Lungs are clear. Heart size and mediastinal contours are within normal limits. No effusion. Visualized bones unremarkable. IMPRESSION: No acute cardiopulmonary disease. Electronically Signed   By: Corlis Leak M.D.   On: 04/20/2023 08:46    Procedures Procedures    Medications Ordered in ED Medications  aspirin chewable tablet 324 mg (324 mg Oral Given 04/20/23 0840)    ED Course/ Medical Decision Making/ A&P                             Medical Decision Making Amount and/or Complexity of Data Reviewed Labs: ordered. Decision-making details documented in ED Course. Radiology: ordered. Decision-making details documented in ED Course. ECG/medicine tests: ordered. Decision-making details documented in ED Course.  Risk OTC drugs.   Medical Decision Making:   Jennifer Arnold is a 37 y.o. female who presented to the ED today with chest  pain detailed above.    Patient's presentation is complicated by their history of diabetes.  Patient placed on continuous vitals and telemetry monitoring while in ED which was reviewed periodically.  Complete initial physical exam performed, notably the patient  was in no acute distress.  Regular rate and rhythm.  Lungs clear to auscultation.  Area of point tenderness  to the left chest which reproduces complaint.  Abdomen soft and nontender.   Patient neurologically intact. Reviewed and confirmed nursing documentation for past medical history, family history, social history.    Initial Assessment:   With the patient's presentation of chest pain, the emergent differential diagnosis of chest pain includes: Acute coronary syndrome, pericarditis, aortic dissection, pulmonary embolism, tension pneumothorax, and esophageal rupture.  I do not believe the patient has an emergent cause of chest pain, other urgent/non-acute considerations include, but are not limited to: chronic angina, aortic stenosis, cardiomyopathy, myocarditis, mitral valve prolapse, pulmonary hypertension, hypertrophic obstructive cardiomyopathy (HOCM), aortic insufficiency, right ventricular hypertrophy, pneumonia, pleuritis, bronchitis, pneumothorax, tumor, gastroesophageal reflux disease (GERD), esophageal spasm, Mallory-Weiss syndrome, peptic ulcer disease, biliary disease, pancreatitis, functional gastrointestinal pain, cervical or thoracic disk disease or arthritis, shoulder arthritis, costochondritis, subacromial bursitis, anxiety or panic attack, herpes zoster, breast disorders, chest wall tumors, thoracic outlet syndrome, mediastinitis.    Initial Plan:  Screening labs including CBC and Metabolic panel to evaluate for infectious or metabolic etiology of disease.  CXR to evaluate for structural/infectious intrathoracic pathology.  EKG and troponin to evaluate for cardiac pathology Blood gas to evaluate for DKA Aspirin  administered Objective evaluation as reviewed   1478 - Pt rechecked. Reports chest pain free since initial evaluation. Updated on findings at this time. Will repeat troponin and then determine disposition. No signs DKA.   2956 - EKG repeated. NSR, no significant changes since previous. No STEMI.   1140 - Patient updated on findings.  Repeat troponin and EKG negative. Remains chest pain free. Recommended [facility] facility, discussion of need for further cardiac workup. Pt expressed understanding of this. Noted on discharge paperwork as well with PD at bedside also aware. Pt stable for discharge. Strict return precautions given.   Initial Study Results:   Laboratory  All laboratory results reviewed without evidence of clinically relevant pathology.   Exceptions include: pCO2 35.8, sodium 132, glucose 339, UA appears contaminated  EKG EKG was reviewed independently. ST segments without concerns for elevations.   EKG: normal EKG, normal sinus rhythm.   Radiology:  All images reviewed independently. Agree with radiology report at this time.   DG Chest Port 1 View  Result Date: 04/20/2023 CLINICAL DATA:  chest pain EXAM: PORTABLE CHEST - 1 VIEW COMPARISON:  None Available. FINDINGS: Lungs are clear. Heart size and mediastinal contours are within normal limits. No effusion. Visualized bones unremarkable. IMPRESSION: No acute cardiopulmonary disease. Electronically Signed   By: Corlis Leak M.D.   On: 04/20/2023 08:46     Final Assessment and Plan:   37 year old female with past medical history diabetes presents to the ED for evaluation of intermittent chest pain.  Chest pain is reproducible on exam.  Does radiate down the left arm.  No associated symptoms.  No personal or family history ACS.  Vital signs within normal limits.  Patient nontoxic-appearing. EKG NSR without acute ST-T changes x 2. Troponin negative x 2. Mild hyponatremia, do not suspect this is related. UA appears  contaminated. CXR negative. Glucose elevated but pH normal, do not suspect DKA or HHS. Discussed all findings with pt who remained chest pain free on multiple evaluations. Will have her closely follow up with outpatient team for further monitoring. Pt stable for discharge home. Strict ED return precautions given.    Clinical Impression:  1. Chest pain, unspecified type      Discharge           Final  Clinical Impression(s) / ED Diagnoses Final diagnoses:  Chest pain, unspecified type    Rx / DC Orders ED Discharge Orders     None         Richardson Dopp 04/20/23 1155    Wynetta Fines, MD 04/20/23 1537

## 2023-04-20 NOTE — Discharge Instructions (Addendum)
Thank you for letting us take care of you today.  Overall, your workup was reassuring with 2 negative cardiac enzymes, a normal EKG, and normal chest x-ray and remainder of blood work without findings to explain your chest pain. Please follow up with your PCP in 2 days for reassessment and to discuss if further cardiac evaluation is warranted in outpatient setting.   For any new or worsening symptoms, please return to nearest emergency department for re-evaluation.

## 2023-04-20 NOTE — ED Triage Notes (Signed)
Pt BIB EMS from [facility]. Per EMS, pt c/o intermittent CP that starts left central and radiates down left arm. Pt states this has happened before. Jail nurse took an EKG that was concerning for infarct. EMS EKG was unremarkable.  Hx of DM type 2. A/Ox4

## 2024-01-12 DIAGNOSIS — Z419 Encounter for procedure for purposes other than remedying health state, unspecified: Secondary | ICD-10-CM | POA: Diagnosis not present

## 2024-02-01 DIAGNOSIS — Z419 Encounter for procedure for purposes other than remedying health state, unspecified: Secondary | ICD-10-CM | POA: Diagnosis not present

## 2024-02-29 DIAGNOSIS — Z419 Encounter for procedure for purposes other than remedying health state, unspecified: Secondary | ICD-10-CM | POA: Diagnosis not present

## 2024-04-11 DIAGNOSIS — Z419 Encounter for procedure for purposes other than remedying health state, unspecified: Secondary | ICD-10-CM | POA: Diagnosis not present

## 2024-05-11 DIAGNOSIS — Z419 Encounter for procedure for purposes other than remedying health state, unspecified: Secondary | ICD-10-CM | POA: Diagnosis not present

## 2024-05-17 ENCOUNTER — Ambulatory Visit (HOSPITAL_COMMUNITY)
Admission: EM | Admit: 2024-05-17 | Discharge: 2024-05-17 | Disposition: A | Discharge status: Home / Self Care | Payer: No Typology Code available for payment source | Attending: Physician Assistant | Admitting: Physician Assistant

## 2024-05-17 ENCOUNTER — Encounter (HOSPITAL_COMMUNITY): Payer: Self-pay | Admitting: Emergency Medicine

## 2024-05-17 DIAGNOSIS — E1142 Type 2 diabetes mellitus with diabetic polyneuropathy: Secondary | ICD-10-CM | POA: Diagnosis not present

## 2024-05-17 DIAGNOSIS — R200 Anesthesia of skin: Secondary | ICD-10-CM | POA: Diagnosis not present

## 2024-05-17 DIAGNOSIS — Z862 Personal history of diseases of the blood and blood-forming organs and certain disorders involving the immune mechanism: Secondary | ICD-10-CM

## 2024-05-17 DIAGNOSIS — Z794 Long term (current) use of insulin: Secondary | ICD-10-CM

## 2024-05-17 DIAGNOSIS — E781 Pure hyperglyceridemia: Secondary | ICD-10-CM | POA: Diagnosis not present

## 2024-05-17 DIAGNOSIS — R202 Paresthesia of skin: Secondary | ICD-10-CM | POA: Diagnosis not present

## 2024-05-17 LAB — COMPREHENSIVE METABOLIC PANEL WITH GFR
ALT: 16 U/L (ref 0–44)
AST: 21 U/L (ref 15–41)
Albumin: 3.6 g/dL (ref 3.5–5.0)
Alkaline Phosphatase: 36 U/L — ABNORMAL LOW (ref 38–126)
Anion gap: 13 (ref 5–15)
BUN: 17 mg/dL (ref 6–20)
CO2: 21 mmol/L — ABNORMAL LOW (ref 22–32)
Calcium: 9.5 mg/dL (ref 8.9–10.3)
Chloride: 104 mmol/L (ref 98–111)
Creatinine, Ser: 1.18 mg/dL — ABNORMAL HIGH (ref 0.44–1.00)
GFR, Estimated: >60 mL/min (ref >60)
Glucose, Bld: 173 mg/dL — ABNORMAL HIGH (ref 70–99)
Potassium: 3.8 mmol/L (ref 3.5–5.1)
Sodium: 138 mmol/L (ref 135–145)
Total Bilirubin: 0.9 mg/dL (ref 0.0–1.2)
Total Protein: 6.9 g/dL (ref 6.5–8.1)

## 2024-05-17 LAB — CBC WITH DIFFERENTIAL/PLATELET
Abs Immature Granulocytes: 0.03 10*3/uL (ref 0.00–0.07)
Basophils Absolute: 0 10*3/uL (ref 0.0–0.1)
Basophils Relative: 0 %
Eosinophils Absolute: 0.2 10*3/uL (ref 0.0–0.5)
Eosinophils Relative: 2 %
HCT: 37.6 % (ref 36.0–46.0)
Hemoglobin: 12 g/dL (ref 12.0–15.0)
Immature Granulocytes: 0 %
Lymphocytes Relative: 17 %
Lymphs Abs: 1.5 10*3/uL (ref 0.7–4.0)
MCH: 27.6 pg (ref 26.0–34.0)
MCHC: 31.9 g/dL (ref 30.0–36.0)
MCV: 86.6 fL (ref 80.0–100.0)
Monocytes Absolute: 0.4 10*3/uL (ref 0.1–1.0)
Monocytes Relative: 5 %
Neutro Abs: 6.7 10*3/uL (ref 1.7–7.7)
Neutrophils Relative %: 76 %
Platelets: 354 10*3/uL (ref 150–400)
RBC: 4.34 MIL/uL (ref 3.87–5.11)
RDW: 14.1 % (ref 11.5–15.5)
WBC: 8.9 10*3/uL (ref 4.0–10.5)
nRBC: 0 % (ref 0.0–0.2)

## 2024-05-17 LAB — POCT URINALYSIS DIP (MANUAL ENTRY)
Bilirubin, UA: NEGATIVE
Blood, UA: NEGATIVE
Glucose, UA: NEGATIVE mg/dL
Leukocytes, UA: NEGATIVE
Nitrite, UA: NEGATIVE
Protein Ur, POC: NEGATIVE mg/dL
Spec Grav, UA: >=1.03 — AB (ref 1.010–1.025)
Urobilinogen, UA: 0.2 U/dL
pH, UA: 6 (ref 5.0–8.0)

## 2024-05-17 LAB — HEMOGLOBIN A1C
Hgb A1c MFr Bld: 6.4 % — ABNORMAL HIGH (ref 4.8–5.6)
Mean Plasma Glucose: 136.98 mg/dL

## 2024-05-17 LAB — VITAMIN B12: Vitamin B-12: 162 pg/mL — ABNORMAL LOW (ref 180–914)

## 2024-05-17 MED ORDER — VITAMIN C 250 MG PO TABS: 250.0000 mg | ORAL_TABLET | Freq: Every day | ORAL | 0 refills | Status: DC

## 2024-05-17 MED ORDER — GLIPIZIDE ER 10 MG PO TB24: 20.0000 mg | ORAL_TABLET | Freq: Every day | ORAL | 0 refills | Status: DC

## 2024-05-17 MED ORDER — METFORMIN HCL 1000 MG PO TABS: 1000.0000 mg | ORAL_TABLET | Freq: Two times a day (BID) | ORAL | 0 refills | Status: DC

## 2024-05-17 MED ORDER — FERROUS SULFATE 325 (65 FE) MG PO TABS: 325.0000 mg | ORAL_TABLET | Freq: Every day | ORAL | 0 refills | Status: DC

## 2024-05-17 MED ORDER — PIOGLITAZONE HCL 15 MG PO TABS: 15.0000 mg | ORAL_TABLET | Freq: Every day | ORAL | 0 refills | Status: DC

## 2024-05-17 MED ORDER — GEMFIBROZIL 600 MG PO TABS: 600.0000 mg | ORAL_TABLET | Freq: Two times a day (BID) | ORAL | 0 refills | Status: DC

## 2024-05-17 NOTE — ED Triage Notes (Signed)
 Pt states she just got out of prison and she needs her cholesterol a DM medication to be refill.

## 2024-05-17 NOTE — Discharge Instructions (Addendum)
 Continue medication as prescribed.  I will contact you if anything is abnormal.  Continue with healthy eating and exercise.  Follow-up with primary care as scheduled.  If you develop any additional symptoms please return for reevaluation.

## 2024-05-17 NOTE — ED Provider Notes (Signed)
 [facility]    CSN: [id number] Arrival date & time: 05/17/24  1726      History   Chief Complaint Chief Complaint  Patient presents with   Medication Refill    HPI Jennifer Arnold is a 38 y.o. female.   Patient presents today requesting refill of all of her medication.  She has not missed any of her doses but ran out of her glipizide  and so will not have this medication tomorrow if she does not get a refill.  She does not currently have a primary care.  She does try to eat a healthy diet and reports that her last A1c was under 7%.  She is open to having blood work obtained today.  She denies any significant medication side effects including GI upset.  She has developed some intermittent numbness and burning in her feet and so is unsure if she could be developing neuropathy.  Denies any polyuria, polydipsia, polyphasia, visual disturbance.  She has not had her B12 level checked and has been on metformin  for several years.  Her current medication regimen includes metformin  1000 mg twice daily, pioglitazone 15 mg daily, glipizide  20 mg daily.  Denies any hypoglycemic episodes.  She has a history of hyper triglyceridemia and is requesting a refill of her gemfibrozil.  Reports history of anemia with hemoglobin of 5 several years ago that required transfusion.  This is related to abnormal uterine bleeding which has since normalized and she is now having menstrual cycles every 28 days that last for a few days and then resolved without intervention.  She has been on ferrous sulfate  with vitamin C for absorption.  Her last blood count was 13 but she has not had this done recently.  Denies any symptoms of anemia or recent abnormal uterine bleeding.    Past Medical History:  Diagnosis Date   Bradycardia    Diabetes mellitus without complication (HCC)    Ovarian cyst     Patient Active Problem List   Diagnosis Date Noted   Type 2 diabetes mellitus with obesity (HCC) 03/10/2020    Morbid obesity (HCC) 03/10/2020   Tobacco abuse 03/10/2020    Past Surgical History:  Procedure Laterality Date   OTHER SURGICAL HISTORY     Thumb surgery   THUMB ARTHROSCOPY      OB History   No obstetric history on file.      Home Medications    Prior to Admission medications   Medication Sig Start Date End Date Taking? Authorizing Provider  gemfibrozil (LOPID) 600 MG tablet Take 1 tablet (600 mg total) by mouth 2 (two) times daily before a meal. 05/17/24  Yes Higinio Grow K, PA-C  pioglitazone (ACTOS) 15 MG tablet Take 1 tablet (15 mg total) by mouth daily. 05/17/24  Yes Tamia Dial K, PA-C  vitamin C (ASCORBIC ACID) 250 MG tablet Take 1 tablet (250 mg total) by mouth daily. 05/17/24  Yes Keldan Eplin, Betsey Brow, PA-C  ferrous sulfate  325 (65 FE) MG tablet Take 1 tablet (325 mg total) by mouth daily. 05/17/24   Rayane Gallardo K, PA-C  glipiZIDE  (GLUCOTROL  XL) 10 MG 24 hr tablet Take 2 tablets (20 mg total) by mouth daily with breakfast. 05/17/24   Clayburn Weekly K, PA-C  medroxyPROGESTERone  (PROVERA ) 10 MG tablet Take 1 tablet (10 mg total) by mouth daily for 10 days. 06/05/22 06/15/22  Ward, Char Common, PA-C  metFORMIN  (GLUCOPHAGE ) 1000 MG tablet Take 1 tablet (1,000 mg total) by mouth 2 (two)  times daily. 05/17/24   Lynley Killilea, Betsey Brow, PA-C  potassium chloride (MICRO-K) 10 MEQ CR capsule Take 10 mEq by mouth 2 (two) times daily.    [provider]  Insulin  Glargine (LANTUS ) 100 UNIT/ML Solostar Pen Inject 10 Units into the skin daily. 02/24/20 03/10/20  Afton Albright, MD    Family History Family History  Problem Relation Age of Onset   Hypertension Mother    Diabetes Mother    Asthma Father    Osteoarthritis Father    Diabetes Other    Hypertension Other    Stroke Other     Social History Social History   Tobacco Use   Smoking status: Every Day    Types: Cigarettes   Smokeless tobacco: Current  Vaping Use   Vaping status: Never Used  Substance Use Topics   Alcohol use: No    Drug use: Yes    Types: Marijuana     Allergies   Patient has no known allergies.   Review of Systems Review of Systems  Constitutional:  Negative for activity change, appetite change, fatigue and fever.  Eyes:  Negative for visual disturbance.  Respiratory:  Negative for shortness of breath.   Cardiovascular:  Negative for chest pain.  Gastrointestinal:  Negative for abdominal pain, diarrhea, nausea and vomiting.  Musculoskeletal:  Negative for arthralgias and myalgias.  Neurological:  Positive for numbness. Negative for dizziness, weakness, light-headedness and headaches.     Physical Exam Triage Vital Signs ED Triage Vitals [05/17/24 1733]  Encounter Vitals Group     BP 111/75     Systolic BP Percentile      Diastolic BP Percentile      Pulse Rate 82     Resp 18     Temp 98 F (36.7 C)     Temp Source Oral     SpO2 99 %     Weight      Height      Head Circumference      Peak Flow      Pain Score 0     Pain Loc      Pain Education      Exclude from Growth Chart    No data found.  Updated Vital Signs BP 111/75 (BP Location: Right Arm)   Pulse 82   Temp 98 F (36.7 C) (Oral)   Resp 18   LMP 04/30/2024 (Exact Date)   SpO2 99%   Visual Acuity Right Eye Distance:   Left Eye Distance:   Bilateral Distance:    Right Eye Near:   Left Eye Near:    Bilateral Near:     Physical Exam Vitals reviewed.  Constitutional:      General: She is awake. She is not in acute distress.    Appearance: Normal appearance. She is well-developed. She is not ill-appearing.     Comments: Very pleasant female appears stated age in no acute distress and comfortable in exam room  HENT:     Head: Normocephalic and atraumatic.     Mouth/Throat:     Pharynx: Uvula midline. No oropharyngeal exudate or posterior oropharyngeal erythema.  Cardiovascular:     Rate and Rhythm: Normal rate and regular rhythm.     Heart sounds: Normal heart sounds, S1 normal and S2 normal. No murmur  heard. Pulmonary:     Effort: Pulmonary effort is normal.     Breath sounds: Normal breath sounds. No wheezing, rhonchi or rales.     Comments: Clear to auscultation  bilaterally Abdominal:     Palpations: Abdomen is soft.     Tenderness: There is no abdominal tenderness.  Musculoskeletal:     Right lower leg: No edema.     Left lower leg: No edema.  Feet:     Right foot:     Protective Sensation: 10 sites tested.  10 sites sensed.     Left foot:     Protective Sensation: 10 sites tested.  10 sites sensed.  Psychiatric:        Behavior: Behavior is cooperative.      UC Treatments / Results  Labs (all labs ordered are listed, but only abnormal results are displayed) Labs Reviewed  HEMOGLOBIN A1C - Abnormal; Notable for the following components:      Result Value   Hgb A1c MFr Bld 6.4 (*)    All other components within normal limits  POCT URINALYSIS DIP (MANUAL ENTRY) - Abnormal; Notable for the following components:   Ketones, POC UA trace (5) (*)    Spec Grav, UA >=1.030 (*)    All other components within normal limits  CBC WITH DIFFERENTIAL/PLATELET  COMPREHENSIVE METABOLIC PANEL WITH GFR  VITAMIN B12    EKG   Radiology No results found.  Procedures Procedures (including critical care time)  Medications Ordered in UC Medications - No data to display  Initial Impression / Assessment and Plan / UC Course  I have reviewed the triage vital signs and the nursing notes.  Pertinent labs & imaging results that were available during my care of the patient were reviewed by me and considered in my medical decision making (see chart for details).     Patient is well-appearing, afebrile, nontoxic, nontachycardic.  UA did have trace ketones but no glucosuria or proteinuria.  Refills were provided of medications as previously prescribed.  She does report some neuropathy symptoms and we discussed this could be a symptom of her diabetes but she has not had a B12 level  obtained recently and has been taking metformin  for a long time so this was added to blood work and we will contact her if we need to discuss supplementation.  She was encouraged to continue with healthy eating and exercise.  She is to monitor her blood glucose at home and keep a log for evaluation of follow-up appointment.  Will establish her with a primary care.  Refills of gemfibrozil were also provided.  CMP was obtained with blood work.  CBC was obtained.  Will refill ferrous sulfate  and vitamin C and make additional recommendations based on her blood counts.  We discussed that if she has recurrent abnormal uterine bleeding or any signs of anemia she should return for reevaluation so we can recheck her blood counts and immediately start her on progesterone therapy to prevent severe anemia.  Final Clinical Impressions(s) / UC Diagnoses   Final diagnoses:  Type 2 diabetes mellitus with diabetic polyneuropathy, with long-term current use of insulin  (HCC)  History of anemia  Hypertriglyceridemia  Numbness and tingling of both feet     Discharge Instructions      Continue medication as prescribed.  I will contact you if anything is abnormal.  Continue with healthy eating and exercise.  Follow-up with primary care as scheduled.  If you develop any additional symptoms please return for reevaluation.  ED Prescriptions     Medication Sig Dispense Auth. Provider   vitamin C (ASCORBIC ACID) 250 MG tablet Take 1 tablet (250 mg total) by mouth daily. 90 tablet  Tyller Bowlby, Betsey Brow, PA-C   glipiZIDE  (GLUCOTROL  XL) 10 MG 24 hr tablet Take 2 tablets (20 mg total) by mouth daily with breakfast. 180 tablet Shakim Faith K, PA-C   pioglitazone (ACTOS) 15 MG tablet Take 1 tablet (15 mg total) by mouth daily. 90 tablet Donyae Kohn K, PA-C   metFORMIN  (GLUCOPHAGE ) 1000 MG tablet Take 1 tablet (1,000 mg total) by mouth 2 (two) times daily. 180 tablet Ethelle Ola K, PA-C   gemfibrozil (LOPID) 600 MG tablet Take  1 tablet (600 mg total) by mouth 2 (two) times daily before a meal. 180 tablet Zacchaeus Halm K, PA-C   ferrous sulfate  325 (65 FE) MG tablet Take 1 tablet (325 mg total) by mouth daily. 90 tablet Lorrayne Ismael K, PA-C      PDMP not reviewed this encounter.   Budd Cargo, PA-C 05/17/24 1844

## 2024-05-18 ENCOUNTER — Ambulatory Visit (HOSPITAL_COMMUNITY): Payer: Self-pay

## 2024-06-11 DIAGNOSIS — Z419 Encounter for procedure for purposes other than remedying health state, unspecified: Secondary | ICD-10-CM | POA: Diagnosis not present

## 2024-07-11 DIAGNOSIS — Z419 Encounter for procedure for purposes other than remedying health state, unspecified: Secondary | ICD-10-CM | POA: Diagnosis not present

## 2024-07-30 ENCOUNTER — Ambulatory Visit (INDEPENDENT_AMBULATORY_CARE_PROVIDER_SITE_OTHER): Payer: No Typology Code available for payment source | Admitting: Family Medicine

## 2024-07-30 ENCOUNTER — Encounter: Payer: Self-pay | Admitting: Family Medicine

## 2024-07-30 VITALS — BP 110/78 | Temp 97.6°F | Ht 69.0 in | Wt 327.4 lb

## 2024-07-30 DIAGNOSIS — E559 Vitamin D deficiency, unspecified: Secondary | ICD-10-CM | POA: Diagnosis not present

## 2024-07-30 DIAGNOSIS — M2550 Pain in unspecified joint: Secondary | ICD-10-CM | POA: Diagnosis not present

## 2024-07-30 DIAGNOSIS — E669 Obesity, unspecified: Secondary | ICD-10-CM

## 2024-07-30 DIAGNOSIS — E1169 Type 2 diabetes mellitus with other specified complication: Secondary | ICD-10-CM | POA: Diagnosis not present

## 2024-07-30 DIAGNOSIS — E538 Deficiency of other specified B group vitamins: Secondary | ICD-10-CM | POA: Diagnosis not present

## 2024-07-30 DIAGNOSIS — N926 Irregular menstruation, unspecified: Secondary | ICD-10-CM | POA: Insufficient documentation

## 2024-07-30 DIAGNOSIS — E781 Pure hyperglyceridemia: Secondary | ICD-10-CM | POA: Insufficient documentation

## 2024-07-30 DIAGNOSIS — D500 Iron deficiency anemia secondary to blood loss (chronic): Secondary | ICD-10-CM | POA: Diagnosis not present

## 2024-07-30 DIAGNOSIS — Z124 Encounter for screening for malignant neoplasm of cervix: Secondary | ICD-10-CM | POA: Insufficient documentation

## 2024-07-30 LAB — COMPREHENSIVE METABOLIC PANEL WITH GFR
ALT: 16 U/L (ref 0–35)
AST: 14 U/L (ref 0–37)
Albumin: 4.3 g/dL (ref 3.5–5.2)
Alkaline Phosphatase: 47 U/L (ref 39–117)
BUN: 20 mg/dL (ref 6–23)
CO2: 25 meq/L (ref 19–32)
Calcium: 9.3 mg/dL (ref 8.4–10.5)
Chloride: 104 meq/L (ref 96–112)
Creatinine, Ser: 0.96 mg/dL (ref 0.40–1.20)
GFR: 75.32 mL/min (ref >60.00)
Glucose, Bld: 190 mg/dL — ABNORMAL HIGH (ref 70–99)
Potassium: 4.5 meq/L (ref 3.5–5.1)
Sodium: 137 meq/L (ref 135–145)
Total Bilirubin: 0.4 mg/dL (ref 0.2–1.2)
Total Protein: 7.7 g/dL (ref 6.0–8.3)

## 2024-07-30 LAB — TSH: TSH: 1.78 u[IU]/mL (ref 0.35–5.50)

## 2024-07-30 LAB — CBC WITH DIFFERENTIAL/PLATELET
Basophils Absolute: 0 K/uL (ref 0.0–0.1)
Basophils Relative: 0.7 % (ref 0.0–3.0)
Eosinophils Absolute: 0.1 K/uL (ref 0.0–0.7)
Eosinophils Relative: 2.3 % (ref 0.0–5.0)
HCT: 37.1 % (ref 36.0–46.0)
Hemoglobin: 11.9 g/dL — ABNORMAL LOW (ref 12.0–15.0)
Lymphocytes Relative: 17.9 % (ref 12.0–46.0)
Lymphs Abs: 1.1 K/uL (ref 0.7–4.0)
MCHC: 32.2 g/dL (ref 30.0–36.0)
MCV: 81 fl (ref 78.0–100.0)
Monocytes Absolute: 0.3 K/uL (ref 0.1–1.0)
Monocytes Relative: 4.7 % (ref 3.0–12.0)
Neutro Abs: 4.6 K/uL (ref 1.4–7.7)
Neutrophils Relative %: 74.4 % (ref 43.0–77.0)
Platelets: 357 K/uL (ref 150.0–400.0)
RBC: 4.58 Mil/uL (ref 3.87–5.11)
RDW: 15.7 % — ABNORMAL HIGH (ref 11.5–15.5)
WBC: 6.2 K/uL (ref 4.0–10.5)

## 2024-07-30 LAB — HEMOGLOBIN A1C: Hgb A1c MFr Bld: 7.8 % — ABNORMAL HIGH (ref 4.6–6.5)

## 2024-07-30 LAB — MICROALBUMIN / CREATININE URINE RATIO
Creatinine,U: 80.9 mg/dL
Microalb Creat Ratio: 25.1 mg/g (ref 0.0–30.0)
Microalb, Ur: 2 mg/dL — ABNORMAL HIGH (ref 0.0–1.9)

## 2024-07-30 LAB — VITAMIN B12: Vitamin B-12: 365 pg/mL (ref 211–911)

## 2024-07-30 LAB — LIPID PANEL
Cholesterol: 158 mg/dL (ref 0–200)
HDL: 39.2 mg/dL (ref >39.00)
LDL Cholesterol: 93 mg/dL (ref 0–99)
NonHDL: 119.14
Total CHOL/HDL Ratio: 4
Triglycerides: 130 mg/dL (ref 0.0–149.0)
VLDL: 26 mg/dL (ref 0.0–40.0)

## 2024-07-30 LAB — C-REACTIVE PROTEIN: CRP: 1.1 mg/dL (ref 0.5–20.0)

## 2024-07-30 LAB — SEDIMENTATION RATE: Sed Rate: 37 mm/h — ABNORMAL HIGH (ref 0–20)

## 2024-07-30 LAB — VITAMIN D 25 HYDROXY (VIT D DEFICIENCY, FRACTURES): VITD: 31.67 ng/mL (ref 30.00–100.00)

## 2024-07-30 MED ORDER — PIOGLITAZONE HCL 15 MG PO TABS: 15.0000 mg | ORAL_TABLET | Freq: Every day | ORAL | 0 refills | Status: DC

## 2024-07-30 MED ORDER — BLOOD GLUCOSE MONITORING SUPPL DEVI
1.0000 | Freq: Three times a day (TID) | 0 refills | Status: AC
Start: 2024-07-30 — End: ?

## 2024-07-30 MED ORDER — LANCET DEVICE MISC
1.0000 | Freq: Three times a day (TID) | 0 refills | Status: AC
Start: 2024-07-30 — End: 2024-08-29

## 2024-07-30 MED ORDER — METFORMIN HCL 1000 MG PO TABS: 1000.0000 mg | ORAL_TABLET | Freq: Two times a day (BID) | ORAL | 0 refills | Status: AC

## 2024-07-30 MED ORDER — FERROUS SULFATE 325 (65 FE) MG PO TABS: 325.0000 mg | ORAL_TABLET | Freq: Every day | ORAL | 0 refills | Status: DC

## 2024-07-30 MED ORDER — LANCETS MISC. MISC: 1.0000 | Freq: Three times a day (TID) | 0 refills | Status: AC

## 2024-07-30 MED ORDER — BLOOD GLUCOSE TEST VI STRP: 1.0000 | ORAL_STRIP | Freq: Three times a day (TID) | 0 refills | Status: AC

## 2024-07-30 MED ORDER — GEMFIBROZIL 600 MG PO TABS: 600.0000 mg | ORAL_TABLET | Freq: Two times a day (BID) | ORAL | 0 refills | Status: AC

## 2024-07-30 MED ORDER — GLIPIZIDE ER 10 MG PO TB24: 20.0000 mg | ORAL_TABLET | Freq: Every day | ORAL | 0 refills | Status: DC

## 2024-07-30 NOTE — Patient Instructions (Addendum)

## 2024-07-30 NOTE — Progress Notes (Signed)
 New Patient Visit  Subjective:     Patient ID: Jennifer Arnold, female    DOB: [birth year], 38 y.o.   MRN: [id number]  Chief Complaint  Patient presents with   Establish Care    HPI  Discussed the use of AI scribe software for clinical note transcription with the patient, who gave verbal consent to proceed.  History of Present Illness Jennifer Arnold is a 38 year old female with abnormal uterine bleeding and diabetes who presents for her first primary care visit after release from prison.  Abnormal uterine bleeding - Episodes of abnormal uterine bleeding lasting up to one month, alternating with periods of amenorrhea or shorter bleeding durations - During incarceration, experienced a month-long episode with hemoglobin level of 5, requiring blood transfusion - Treated with iron, vitamin C , and Provera , resulting in regulation to a normal five-day menstrual period - Critically low iron saturation during incarceration; iron infusions were considered but not administered prior to release  Hyperglycemia and diabetes management - Diabetes previously managed with insulin  while incarcerated; blood glucose levels remained around 300 mg/dL despite 55 units of Lantus  - At home, oral medications controlled blood glucose between 100 and 150 mg/dL - Since release, blood glucose increased to 200 mg/dL on glipizide , metformin , and Actos  - Metformin  is ineffective; better glycemic control attributed to glipizide  and Actos  - Self-adjusted Actos  from 30 mg to 15 mg due to hypoglycemia - Currently taking 20 mg glipizide  and 600 mg gemfibrozil  twice daily  Extremity pain - Pain in feet and hands, relieved by topical rub  Tobacco use - Smoked since age 41 - Quit smoking in August 2023 during incarceration and has not resumed     ROS Per HPI  Outpatient Encounter Medications as of 07/30/2024  Medication Sig   Blood Glucose Monitoring Suppl DEVI 1 each by Does not apply route in the morning,  at noon, and at bedtime. May substitute to any manufacturer covered by patient's insurance.   Glucose Blood (BLOOD GLUCOSE TEST STRIPS) STRP 1 each by In Vitro route in the morning, at noon, and at bedtime. May substitute to any manufacturer covered by patient's insurance.   Lancet Device MISC 1 each by Does not apply route in the morning, at noon, and at bedtime. May substitute to any manufacturer covered by patient's insurance.   Lancets Misc. MISC 1 each by Does not apply route in the morning, at noon, and at bedtime. May substitute to any manufacturer covered by patient's insurance.   [DISCONTINUED] ferrous sulfate  325 (65 FE) MG tablet Take 1 tablet (325 mg total) by mouth daily.   [DISCONTINUED] gemfibrozil  (LOPID ) 600 MG tablet Take 1 tablet (600 mg total) by mouth 2 (two) times daily before a meal.   [DISCONTINUED] glipiZIDE  (GLUCOTROL  XL) 10 MG 24 hr tablet Take 2 tablets (20 mg total) by mouth daily with breakfast.   [DISCONTINUED] metFORMIN  (GLUCOPHAGE ) 1000 MG tablet Take 1 tablet (1,000 mg total) by mouth 2 (two) times daily.   [DISCONTINUED] pioglitazone  (ACTOS ) 15 MG tablet Take 1 tablet (15 mg total) by mouth daily.   ferrous sulfate  325 (65 FE) MG tablet Take 1 tablet (325 mg total) by mouth daily.   gemfibrozil  (LOPID ) 600 MG tablet Take 1 tablet (600 mg total) by mouth 2 (two) times daily before a meal.   glipiZIDE  (GLUCOTROL  XL) 10 MG 24 hr tablet Take 2 tablets (20 mg total) by mouth daily with breakfast.   medroxyPROGESTERone  (PROVERA ) 10 MG tablet Take 1 tablet (  10 mg total) by mouth daily for 10 days. (Patient not taking: Reported on 07/30/2024)   metFORMIN  (GLUCOPHAGE ) 1000 MG tablet Take 1 tablet (1,000 mg total) by mouth 2 (two) times daily.   pioglitazone  (ACTOS ) 15 MG tablet Take 1 tablet (15 mg total) by mouth daily.   potassium chloride (MICRO-K) 10 MEQ CR capsule Take 10 mEq by mouth 2 (two) times daily. (Patient not taking: Reported on 07/30/2024)   vitamin C  (ASCORBIC  ACID) 250 MG tablet Take 1 tablet (250 mg total) by mouth daily. (Patient not taking: Reported on 07/30/2024)   [DISCONTINUED] Insulin  Glargine (LANTUS ) 100 UNIT/ML Solostar Pen Inject 10 Units into the skin daily.   No facility-administered encounter medications on file as of 07/30/2024.    Past Medical History:  Diagnosis Date   Bradycardia    Diabetes mellitus without complication (HCC)    Ovarian cyst     Past Surgical History:  Procedure Laterality Date   OTHER SURGICAL HISTORY     Thumb surgery   THUMB ARTHROSCOPY      Family History  Problem Relation Age of Onset   Hypertension Mother    Diabetes Mother    Asthma Father    Osteoarthritis Father    Diabetes Other    Hypertension Other    Stroke Other     Social History   Socioeconomic History   Marital status: Single    Spouse name: Not on file   Number of children: Not on file   Years of education: Not on file   Highest education level: Not on file  Occupational History   Not on file  Tobacco Use   Smoking status: Every Day    Types: Cigarettes   Smokeless tobacco: Current  Vaping Use   Vaping status: Never Used  Substance and Sexual Activity   Alcohol use: No   Drug use: Yes    Types: Marijuana   Sexual activity: Yes    Birth control/protection: None  Other Topics Concern   Not on file  Social History Narrative   Not on file   Social Drivers of Health   Financial Resource Strain: Not on file  Food Insecurity: Not on file  Transportation Needs: Not on file  Physical Activity: Not on file  Stress: Not on file  Social Connections: Not on file  Intimate Partner Violence: Not on file       Objective:    BP 110/78 (BP Location: Left Arm, Patient Position: Sitting)   Temp 97.6 F (36.4 C) (Temporal)   Ht 5' 9 (1.753 m)   Wt (!) 327 lb 6.4 oz (148.5 kg)   BMI 48.35 kg/m    Physical Exam Vitals and nursing note reviewed.  Constitutional:      General: She is not in acute distress.     Appearance: Normal appearance. She is obese.  HENT:     Head: Normocephalic and atraumatic.     Right Ear: External ear normal.     Left Ear: External ear normal.     Nose: Nose normal.     Mouth/Throat:     Mouth: Mucous membranes are moist.     Pharynx: Oropharynx is clear.  Eyes:     Extraocular Movements: Extraocular movements intact.     Pupils: Pupils are equal, round, and reactive to light.  Cardiovascular:     Rate and Rhythm: Normal rate and regular rhythm.     Pulses: Normal pulses.     Heart sounds: Normal heart  sounds.  Pulmonary:     Effort: Pulmonary effort is normal. No respiratory distress.     Breath sounds: Normal breath sounds. No wheezing, rhonchi or rales.  Musculoskeletal:        General: Normal range of motion.     Cervical back: Normal range of motion.     Right lower leg: No edema.     Left lower leg: No edema.  Lymphadenopathy:     Cervical: No cervical adenopathy.  Neurological:     General: No focal deficit present.     Mental Status: She is alert and oriented to person, place, and time.  Psychiatric:        Mood and Affect: Mood normal.        Thought Content: Thought content normal.     No results found for any visits on 07/30/24.      Assessment & Plan:   Assessment and Plan Assessment & Plan Type 2 diabetes mellitus Type 2 diabetes with recent medication source changes causing increased blood glucose levels. Glipizide  and Actos  effective; metformin  ineffective and may interfere with B12 absorption. - Discontinue metformin . - Continue glipizide  and Actos . - Order glucometer kit for home blood glucose monitoring.  Abnormal uterine bleeding with iron deficiency anemia Severe bleeding previously required transfusion. Well-controlled with Provera . Iron supplementation ineffective due to low saturation. - Consider iron infusion if necessary. - Ref to OBGYN to est care  Vitamin D  Deficiency - Vit D today  Vitamin B12 deficiency B12  deficiency possibly exacerbated by metformin . Currently on B12 supplements and multivitamins. - Separate administration of B12 supplements and metformin  to improve absorption, although metformin  is to be discontinued.  Hyperlipidemia Efficacy of gemfibrozil  to be evaluated. - Order lipid panel to assess efficacy of gemfibrozil .  Foot and hand pain Intermittent pain possibly related to family history of autoimmune disorders. Relief with topical diclofenac gel. - Consider further evaluation for autoimmune disorders if symptoms persist or worsen. - Continue use of topical diclofenac gel for symptomatic relief.         Orders Placed This Encounter  Procedures   CBC with Differential/Platelet    Release to patient:   Immediate [1]   Comprehensive metabolic panel with GFR    Release to patient:   Immediate [1]   Hemoglobin A1c   Lipid panel   Microalbumin / creatinine urine ratio    Release to patient:   Immediate   VITAMIN D  25 Hydroxy (Vit-D Deficiency, Fractures)   Vitamin B12   TSH   Iron, TIBC and Ferritin Panel   Sedimentation rate   Rheumatoid Factor   C-reactive protein   ANA,IFA RA Diag Pnl w/rflx Tit/Patn   Ambulatory referral to Obstetrics / Gynecology    Referral Priority:   Routine    Referral Type:   Consultation    Referral Reason:   Specialty Services Required    Requested Specialty:   Obstetrics and Gynecology    Number of Visits Requested:   1     Meds ordered this encounter  Medications   ferrous sulfate  325 (65 FE) MG tablet    Sig: Take 1 tablet (325 mg total) by mouth daily.    Dispense:  90 tablet    Refill:  0   gemfibrozil  (LOPID ) 600 MG tablet    Sig: Take 1 tablet (600 mg total) by mouth 2 (two) times daily before a meal.    Dispense:  180 tablet    Refill:  0   glipiZIDE  (GLUCOTROL   XL) 10 MG 24 hr tablet    Sig: Take 2 tablets (20 mg total) by mouth daily with breakfast.    Dispense:  180 tablet    Refill:  0   metFORMIN  (GLUCOPHAGE )  1000 MG tablet    Sig: Take 1 tablet (1,000 mg total) by mouth 2 (two) times daily.    Dispense:  180 tablet    Refill:  0   pioglitazone  (ACTOS ) 15 MG tablet    Sig: Take 1 tablet (15 mg total) by mouth daily.    Dispense:  90 tablet    Refill:  0   Blood Glucose Monitoring Suppl DEVI    Sig: 1 each by Does not apply route in the morning, at noon, and at bedtime. May substitute to any manufacturer covered by patient's insurance.    Dispense:  1 each    Refill:  0    Covered brand   Glucose Blood (BLOOD GLUCOSE TEST STRIPS) STRP    Sig: 1 each by In Vitro route in the morning, at noon, and at bedtime. May substitute to any manufacturer covered by patient's insurance.    Dispense:  100 strip    Refill:  0   Lancet Device MISC    Sig: 1 each by Does not apply route in the morning, at noon, and at bedtime. May substitute to any manufacturer covered by patient's insurance.    Dispense:  1 each    Refill:  0   Lancets Misc. MISC    Sig: 1 each by Does not apply route in the morning, at noon, and at bedtime. May substitute to any manufacturer covered by patient's insurance.    Dispense:  100 each    Refill:  0    Return in about 3 months (around 10/30/2024) for meds.  Corean LITTIE Ku, FNP

## 2024-07-31 ENCOUNTER — Ambulatory Visit: Payer: Self-pay | Admitting: Family Medicine

## 2024-10-30 ENCOUNTER — Ambulatory Visit: Admitting: Family Medicine

## 2024-10-30 NOTE — Progress Notes (Deleted)
   Established Patient Office Visit  Subjective:     Patient ID: Jennifer Arnold, female    DOB: [birth year], 38 y.o.   MRN: [id number]  No chief complaint on file.   HPI  Discussed the use of AI scribe software for clinical note transcription with the patient, who gave verbal consent to proceed.  History of Present Illness      ROS Per HPI      Objective:    There were no vitals taken for this visit.   Physical Exam Vitals and nursing note reviewed.  Constitutional:      General: She is not in acute distress.    Appearance: Normal appearance. She is normal weight.  HENT:     Head: Normocephalic and atraumatic.     Right Ear: External ear normal.     Left Ear: External ear normal.     Nose: Nose normal.     Mouth/Throat:     Mouth: Mucous membranes are moist.     Pharynx: Oropharynx is clear.  Eyes:     Extraocular Movements: Extraocular movements intact.     Pupils: Pupils are equal, round, and reactive to light.  Cardiovascular:     Rate and Rhythm: Normal rate and regular rhythm.     Pulses: Normal pulses.     Heart sounds: Normal heart sounds.  Pulmonary:     Effort: Pulmonary effort is normal. No respiratory distress.     Breath sounds: Normal breath sounds. No wheezing, rhonchi or rales.  Musculoskeletal:        General: Normal range of motion.     Cervical back: Normal range of motion.     Right lower leg: No edema.     Left lower leg: No edema.  Lymphadenopathy:     Cervical: No cervical adenopathy.  Neurological:     General: No focal deficit present.     Mental Status: She is alert and oriented to person, place, and time.  Psychiatric:        Mood and Affect: Mood normal.        Thought Content: Thought content normal.     No results found for any visits on 10/30/24.  The ASCVD Risk score (Arnett DK, et al., 2019) failed to calculate for the following reasons:   The 2019 ASCVD risk score is only valid for ages 18 to 53  {Vitals History  (Optional):23777}  {Show previous labs (optional):23779}     Assessment & Plan:   Assessment and Plan Assessment & Plan      No orders of the defined types were placed in this encounter.    No orders of the defined types were placed in this encounter.   No follow-ups on file.  Corean LITTIE Ku, FNP

## 2024-11-17 ENCOUNTER — Other Ambulatory Visit: Payer: Self-pay | Admitting: Family Medicine

## 2024-11-17 DIAGNOSIS — D500 Iron deficiency anemia secondary to blood loss (chronic): Secondary | ICD-10-CM

## 2024-11-17 DIAGNOSIS — E669 Obesity, unspecified: Secondary | ICD-10-CM

## 2024-11-17 MED ORDER — FERROUS SULFATE 325 (65 FE) MG PO TABS: 325.0000 mg | ORAL_TABLET | Freq: Every day | ORAL | 0 refills | Status: AC

## 2024-11-17 MED ORDER — PIOGLITAZONE HCL 15 MG PO TABS: 15.0000 mg | ORAL_TABLET | Freq: Every day | ORAL | 0 refills | Status: AC

## 2024-11-17 MED ORDER — GLIPIZIDE ER 10 MG PO TB24: 20.0000 mg | ORAL_TABLET | Freq: Every day | ORAL | 0 refills | Status: AC

## 2024-11-17 NOTE — Telephone Encounter (Signed)
 Copied from CRM [id number]. Topic: Clinical - Medication Refill >> Nov 17, 2024 11:25 AM Macario HERO wrote: Medication: glipiZIDE  (GLUCOTROL  XL) 10 MG 24 hr tablet [[id number]] ferrous sulfate  325 (65 FE) MG tablet [[id number]] pioglitazone  (ACTOS ) 15 MG tablet [[id number]]   Has the patient contacted their pharmacy? No (Agent: If no, request that the patient contact the pharmacy for the refill. If patient does not wish to contact the pharmacy document the reason why and proceed with request.) (Agent: If yes, when and what did the pharmacy advise?)  This is the patient's preferred pharmacy:  [facility] [address] ([address]), [place] - [address] [place] ([place]) [place] [address] Phone: [phone] Fax: [phone]   Is this the correct pharmacy for this prescription? Yes If no, delete pharmacy and type the correct one.   Has the prescription been filled recently? Yes  Is the patient out of the medication? Yes  Has the patient been seen for an appointment in the last year OR does the patient have an upcoming appointment? Yes  Can we respond through MyChart? Yes  Agent: Please be advised that Rx refills may take up to 3 business days. We ask that you follow-up with your pharmacy.

## 2024-12-08 ENCOUNTER — Ambulatory Visit: Admitting: Family Medicine

## 2024-12-08 NOTE — Patient Instructions (Incomplete)
 We are checking labs today, will be in contact with any results that require further attention

## 2024-12-08 NOTE — Progress Notes (Deleted)
   Established Patient Office Visit  Subjective:     Patient ID: Jennifer Arnold, female    DOB: Jul 21, 1986, 38 y.o.   MRN: [id number]  No chief complaint on file.   HPI  Discussed the use of AI scribe software for clinical note transcription with the patient, who gave verbal consent to proceed.  History of Present Illness      ROS Per HPI      Objective:    There were no vitals taken for this visit.   Physical Exam Vitals and nursing note reviewed.  Constitutional:      General: She is not in acute distress.    Appearance: Normal appearance. She is normal weight.  HENT:     Head: Normocephalic and atraumatic.     Right Ear: External ear normal.     Left Ear: External ear normal.     Nose: Nose normal.     Mouth/Throat:     Mouth: Mucous membranes are moist.     Pharynx: Oropharynx is clear.  Eyes:     Extraocular Movements: Extraocular movements intact.     Pupils: Pupils are equal, round, and reactive to light.  Cardiovascular:     Rate and Rhythm: Normal rate and regular rhythm.     Pulses: Normal pulses.     Heart sounds: Normal heart sounds.  Pulmonary:     Effort: Pulmonary effort is normal. No respiratory distress.     Breath sounds: Normal breath sounds. No wheezing, rhonchi or rales.  Musculoskeletal:        General: Normal range of motion.     Cervical back: Normal range of motion.     Right lower leg: No edema.     Left lower leg: No edema.  Lymphadenopathy:     Cervical: No cervical adenopathy.  Neurological:     General: No focal deficit present.     Mental Status: She is alert and oriented to person, place, and time.  Psychiatric:        Mood and Affect: Mood normal.        Thought Content: Thought content normal.     No results found for any visits on 12/08/24.  The ASCVD Risk score (Arnett DK, et al., 2019) failed to calculate for the following reasons:   The 2019 ASCVD risk score is only valid for ages 83 to 69  {Vitals History  (Optional):23777}  {Show previous labs (optional):23779}     Assessment & Plan:   Assessment and Plan Assessment & Plan      No orders of the defined types were placed in this encounter.    No orders of the defined types were placed in this encounter.   No follow-ups on file.  Corean LITTIE Ku, FNP

## 2025-01-11 ENCOUNTER — Other Ambulatory Visit: Payer: Self-pay

## 2025-01-11 ENCOUNTER — Other Ambulatory Visit (HOSPITAL_COMMUNITY)
Admission: RE | Admit: 2025-01-11 | Discharge: 2025-01-11 | Disposition: A | Payer: MEDICAID | Source: Ambulatory Visit | Attending: Obstetrics and Gynecology | Admitting: Obstetrics and Gynecology

## 2025-01-11 ENCOUNTER — Ambulatory Visit: Admitting: Obstetrics and Gynecology

## 2025-01-11 ENCOUNTER — Encounter: Payer: Self-pay | Admitting: Obstetrics and Gynecology

## 2025-01-11 VITALS — BP 124/84 | HR 77 | Wt 327.2 lb

## 2025-01-11 DIAGNOSIS — N760 Acute vaginitis: Secondary | ICD-10-CM | POA: Insufficient documentation

## 2025-01-11 DIAGNOSIS — Z113 Encounter for screening for infections with a predominantly sexual mode of transmission: Secondary | ICD-10-CM | POA: Insufficient documentation

## 2025-01-11 DIAGNOSIS — Z124 Encounter for screening for malignant neoplasm of cervix: Secondary | ICD-10-CM | POA: Diagnosis present

## 2025-01-11 DIAGNOSIS — N939 Abnormal uterine and vaginal bleeding, unspecified: Secondary | ICD-10-CM | POA: Diagnosis not present

## 2025-01-11 LAB — POCT PREGNANCY, URINE: Preg Test, Ur: NEGATIVE

## 2025-01-11 NOTE — Progress Notes (Signed)
 "  NEW GYNECOLOGY PATIENT Patient name: Jennifer Arnold MRN [id number]  Date of birth: 03/09/1986 Chief Complaint:   Gynecologic Exam   History:  Discussed the use of AI scribe software for clinical note transcription with the patient, who gave verbal consent to proceed.  History of Present Illness Jennifer Arnold is a 39 year old female who presents with amenorrhea and concerns about vaginal discharge.  She has long-standing irregular menses with past episodes of bleeding lasting from 3 days to a month. During incarceration she had a prolonged period requiring blood transfusion and was started on Provera , which regulated cycles for about 6 months. She has had no menstrual bleeding for the past 4 to 5 months and has not had further workup. An ultrasound while incarcerated showed fibroids. She has never been pregnant, is sexually active with a female partner, uses female condoms for contraception, and does not strongly desire pregnancy. She denies pain with intercourse. Bleeding has varied from 2 days to 1 month. Last menses was in July/August. She has experience spotting for about a week (dark brown) but no true menses. Not sure if she has had pain but has had abdominal side pain and previously diagnosed with an ovarian cyst but this is not the same type of pain that was present at the time of that diagnosis. Would like to have a more regular cycle.   She previously used cocaine but is not using methadone or heroin. She had significant weight gain after stopping drug use, with current weight around 250 pounds or more.  Over the past few months she has had a white flaky vaginal discharge. She was diagnosed with bacterial vaginosis and completed treatment, which resolved odor, but the odor has recurred and she wonders if this is related to soap. She does not douche and uses condoms consistently. Her last Pap smear was in December 2024 while incarcerated.  She has anemia in the past requiring transfusion  and has not seen her primary care provider in several months. She has diabetes, takes medication daily, but is not checking blood sugars at home. Her recent blood sugars have been 150 to 200, compared with 100 to 150 while incarcerated. She reports no abnormal vaginal discharge other than the white flaky substance and no significant changes in bowel habits or other skin issues beyond burns.       Gynecologic History No LMP recorded. Contraception: condoms   OB History  No obstetric history on file.     The following portions of the patient's history were reviewed and updated as appropriate: allergies, current medications, past family history, past medical history, past social history, past surgical history and problem list. Health Maintenance  Topic Date Due   COVID-19 Vaccine (1) Never done   Complete foot exam   Never done   Eye exam for diabetics  Never done   HIV Screening  Never done   Hepatitis C Screening  Never done   DTaP/Tdap/Td vaccine (1 - Tdap) Never done   Pneumococcal Vaccine (1 of 2 - PCV) Never done   Hepatitis B Vaccine (1 of 3 - 19+ 3-dose series) Never done   HPV Vaccine (1 - 3-dose SCDM series) Never done   Pap with HPV screening  Never done   Flu Shot  Never done   Hemoglobin A1C  01/30/2025   Yearly kidney function blood test for diabetes  07/30/2025   Yearly kidney health urinalysis for diabetes  07/30/2025   Meningitis B Vaccine  Aged  Out     Review of Systems Pertinent items noted in HPI and remainder of comprehensive ROS otherwise negative.  Physical Exam:  BP 124/84   Pulse 77   Wt (!) 327 lb 3.2 oz (148.4 kg)   BMI 48.32 kg/m  Physical Exam Vitals and nursing note reviewed. Exam conducted with a chaperone present.  Constitutional:      Appearance: Normal appearance.  Cardiovascular:     Rate and Rhythm: Normal rate.  Pulmonary:     Effort: Pulmonary effort is normal.     Breath sounds: Normal breath sounds.  Genitourinary:     General: Normal vulva.     Vagina: Normal.     Cervix: Normal.  Neurological:     General: No focal deficit present.     Mental Status: She is alert and oriented to person, place, and time.  Psychiatric:        Mood and Affect: Mood normal.        Behavior: Behavior normal.        Thought Content: Thought content normal.        Judgment: Judgment normal.        Assessment and Plan:   Assessment & Plan Abnormal Uterine Bleeding Chronic irregular bleeding with prolonged amenorrhea. Differential includes anovulation, PCOS, or endometrial hyperplasia. Concern for hyperplasia or malignancy. - Ordered pelvic ultrasound to assess endometrial lining and general gyn structures. - UPT negative in office today - Administer Provera  to induce menstruation and assess if HPO axis intact - Consider regular Provera  if hyperplasia present.  Uterine Fibroids Fibroids contribute to heavy bleeding, less likely cause of amenorrhea. - Ordered pelvic ultrasound to evaluate gyn structures.  Bacterial Vaginosis Recurrent symptoms with possible recurrence due to soap use. - Swabbed for bacterial vaginosis. - Advised minimally processed soap for hygiene.  Type 2 Diabetes Mellitus Blood glucose levels 150-200 mg/dL, no recent home monitoring. - Ordered A1c test for long-term glucose control.  Anemia due to Chronic Blood Loss Anemia secondary to chronic blood loss from uterine bleeding. - Ordered complete blood count to assess anemia status.   Follow-up: No follow-ups on file.      Carter Quarry, MD Obstetrician & Gynecologist, [facility] [employer], [facility] "

## 2025-01-12 LAB — CERVICOVAGINAL ANCILLARY ONLY
Bacterial Vaginitis (gardnerella): NEGATIVE
Candida Glabrata: POSITIVE — AB
Candida Vaginitis: POSITIVE — AB
Chlamydia: NEGATIVE
Comment: NEGATIVE
Comment: NEGATIVE
Comment: NEGATIVE
Comment: NEGATIVE
Comment: NEGATIVE
Comment: NORMAL
Neisseria Gonorrhea: NEGATIVE
Trichomonas: NEGATIVE

## 2025-01-12 LAB — TSH RFX ON ABNORMAL TO FREE T4: TSH: 1.95 u[IU]/mL (ref 0.450–4.500)

## 2025-01-13 ENCOUNTER — Ambulatory Visit: Payer: Self-pay | Admitting: Obstetrics and Gynecology

## 2025-01-13 ENCOUNTER — Encounter: Payer: Self-pay | Admitting: Obstetrics and Gynecology

## 2025-01-13 DIAGNOSIS — B379 Candidiasis, unspecified: Secondary | ICD-10-CM

## 2025-01-13 DIAGNOSIS — B3731 Acute candidiasis of vulva and vagina: Secondary | ICD-10-CM

## 2025-01-13 LAB — CBC
Hematocrit: 40.3 % (ref 34.0–46.6)
Hemoglobin: 13.2 g/dL (ref 11.1–15.9)
MCH: 29.2 pg (ref 26.6–33.0)
MCHC: 32.8 g/dL (ref 31.5–35.7)
MCV: 89 fL (ref 79–97)
Platelets: 293 x10E3/uL (ref 150–450)
RBC: 4.52 x10E6/uL (ref 3.77–5.28)
RDW: 13.4 % (ref 11.7–15.4)
WBC: 7.1 x10E3/uL (ref 3.4–10.8)

## 2025-01-13 LAB — TESTOSTERONE,FREE AND TOTAL
Testosterone, Free: 0.6 pg/mL (ref 0.0–4.2)
Testosterone: 17 ng/dL (ref 8–60)

## 2025-01-13 LAB — RPR+HBSAG+HCVAB+...
HIV Screen 4th Generation wRfx: NONREACTIVE
Hep C Virus Ab: NONREACTIVE
Hepatitis B Surface Ag: NEGATIVE
RPR Ser Ql: NONREACTIVE

## 2025-01-13 LAB — HEMOGLOBIN A1C
Est. average glucose Bld gHb Est-mCnc: 186 mg/dL
Hgb A1c MFr Bld: 8.1 % — ABNORMAL HIGH (ref 4.8–5.6)

## 2025-01-13 LAB — PROLACTIN: Prolactin: 17.6 ng/mL (ref 4.8–33.4)

## 2025-01-13 MED ORDER — BORIC ACID CRYS: 600.0000 mg | CRYSTALS | Freq: Every day | 0 refills | Status: AC

## 2025-01-13 MED ORDER — FLUCONAZOLE 150 MG PO TABS: 150.0000 mg | ORAL_TABLET | Freq: Once | ORAL | 1 refills | Status: AC

## 2025-01-14 LAB — CYTOLOGY - PAP
Comment: NEGATIVE
Diagnosis: NEGATIVE
Diagnosis: REACTIVE
High risk HPV: NEGATIVE

## 2025-01-19 ENCOUNTER — Ambulatory Visit (HOSPITAL_COMMUNITY): Admission: RE | Admit: 2025-01-19 | Payer: MEDICAID | Source: Ambulatory Visit

## 2025-02-02 ENCOUNTER — Ambulatory Visit (HOSPITAL_COMMUNITY): Payer: MEDICAID

## 2025-02-09 ENCOUNTER — Ambulatory Visit (HOSPITAL_COMMUNITY): Payer: MEDICAID
